# Patient Record
Sex: Female | Born: 1935
Health system: Southern US, Community
[De-identification: ages and names within clinical notes are randomized; demographics above are authoritative.]

## PROBLEM LIST (undated history)

## (undated) DIAGNOSIS — E7800 Pure hypercholesterolemia, unspecified: Secondary | ICD-10-CM

## (undated) DIAGNOSIS — I10 Essential (primary) hypertension: Secondary | ICD-10-CM

## (undated) DIAGNOSIS — E119 Type 2 diabetes mellitus without complications: Secondary | ICD-10-CM

## (undated) HISTORY — PX: ABDOMINAL HYSTERECTOMY: SHX81

## (undated) HISTORY — DX: Pure hypercholesterolemia, unspecified: E78.00

## (undated) HISTORY — PX: ABDOMINAL SURGERY: SHX537

## (undated) HISTORY — PX: TONSILLECTOMY: SUR1361

---

## 1998-05-31 ENCOUNTER — Other Ambulatory Visit: Admission: RE | Admit: 1998-05-31 | Discharge: 1998-05-31 | Payer: Self-pay | Admitting: Family Medicine

## 2000-01-16 ENCOUNTER — Encounter: Payer: Self-pay | Admitting: Family Medicine

## 2000-01-16 ENCOUNTER — Ambulatory Visit (HOSPITAL_COMMUNITY): Admission: RE | Admit: 2000-01-16 | Discharge: 2000-01-16 | Payer: Self-pay | Admitting: Family Medicine

## 2001-02-16 ENCOUNTER — Encounter: Payer: Self-pay | Admitting: Family Medicine

## 2001-02-16 ENCOUNTER — Ambulatory Visit (HOSPITAL_COMMUNITY): Admission: RE | Admit: 2001-02-16 | Discharge: 2001-02-16 | Payer: Self-pay | Admitting: Family Medicine

## 2001-03-10 ENCOUNTER — Encounter: Admission: RE | Admit: 2001-03-10 | Discharge: 2001-05-04 | Payer: Self-pay | Admitting: Family Medicine

## 2001-08-24 ENCOUNTER — Other Ambulatory Visit: Admission: RE | Admit: 2001-08-24 | Discharge: 2001-08-24 | Payer: Self-pay | Admitting: Family Medicine

## 2002-03-29 ENCOUNTER — Encounter: Payer: Self-pay | Admitting: Family Medicine

## 2002-03-29 ENCOUNTER — Ambulatory Visit (HOSPITAL_COMMUNITY): Admission: RE | Admit: 2002-03-29 | Discharge: 2002-03-29 | Payer: Self-pay | Admitting: Family Medicine

## 2003-02-04 HISTORY — PX: ANKLE SURGERY: SHX546

## 2003-07-05 ENCOUNTER — Ambulatory Visit (HOSPITAL_COMMUNITY): Admission: RE | Admit: 2003-07-05 | Discharge: 2003-07-05 | Payer: Self-pay | Admitting: Family Medicine

## 2003-08-09 ENCOUNTER — Emergency Department (HOSPITAL_COMMUNITY): Admission: EM | Admit: 2003-08-09 | Discharge: 2003-08-09 | Payer: Self-pay

## 2003-08-22 ENCOUNTER — Ambulatory Visit (HOSPITAL_BASED_OUTPATIENT_CLINIC_OR_DEPARTMENT_OTHER): Admission: RE | Admit: 2003-08-22 | Discharge: 2003-08-22 | Payer: Self-pay | Admitting: Orthopedic Surgery

## 2003-08-22 ENCOUNTER — Ambulatory Visit (HOSPITAL_COMMUNITY): Admission: RE | Admit: 2003-08-22 | Discharge: 2003-08-22 | Payer: Self-pay | Admitting: Orthopedic Surgery

## 2003-09-11 ENCOUNTER — Encounter: Admission: RE | Admit: 2003-09-11 | Discharge: 2003-11-28 | Payer: Self-pay | Admitting: Orthopedic Surgery

## 2004-03-07 ENCOUNTER — Other Ambulatory Visit: Admission: RE | Admit: 2004-03-07 | Discharge: 2004-03-07 | Payer: Self-pay | Admitting: Family Medicine

## 2004-11-11 ENCOUNTER — Ambulatory Visit (HOSPITAL_COMMUNITY): Admission: RE | Admit: 2004-11-11 | Discharge: 2004-11-11 | Payer: Self-pay | Admitting: Family Medicine

## 2004-12-09 ENCOUNTER — Emergency Department (HOSPITAL_COMMUNITY): Admission: EM | Admit: 2004-12-09 | Discharge: 2004-12-09 | Payer: Self-pay | Admitting: Emergency Medicine

## 2015-12-04 ENCOUNTER — Emergency Department (HOSPITAL_COMMUNITY)
Admission: EM | Admit: 2015-12-04 | Discharge: 2015-12-04 | Disposition: A | Discharge status: Home / Self Care | Payer: Medicare HMO | Attending: Emergency Medicine | Admitting: Emergency Medicine

## 2015-12-04 ENCOUNTER — Emergency Department (HOSPITAL_COMMUNITY): Payer: Medicare HMO

## 2015-12-04 ENCOUNTER — Encounter (HOSPITAL_COMMUNITY): Payer: Self-pay | Admitting: Emergency Medicine

## 2015-12-04 DIAGNOSIS — Y939 Activity, unspecified: Secondary | ICD-10-CM | POA: Insufficient documentation

## 2015-12-04 DIAGNOSIS — E119 Type 2 diabetes mellitus without complications: Secondary | ICD-10-CM | POA: Diagnosis not present

## 2015-12-04 DIAGNOSIS — Y929 Unspecified place or not applicable: Secondary | ICD-10-CM | POA: Insufficient documentation

## 2015-12-04 DIAGNOSIS — S01111A Laceration without foreign body of right eyelid and periocular area, initial encounter: Secondary | ICD-10-CM | POA: Diagnosis not present

## 2015-12-04 DIAGNOSIS — Z87891 Personal history of nicotine dependence: Secondary | ICD-10-CM | POA: Diagnosis not present

## 2015-12-04 DIAGNOSIS — S0181XA Laceration without foreign body of other part of head, initial encounter: Secondary | ICD-10-CM

## 2015-12-04 DIAGNOSIS — W0110XA Fall on same level from slipping, tripping and stumbling with subsequent striking against unspecified object, initial encounter: Secondary | ICD-10-CM | POA: Diagnosis not present

## 2015-12-04 DIAGNOSIS — Y999 Unspecified external cause status: Secondary | ICD-10-CM | POA: Insufficient documentation

## 2015-12-04 DIAGNOSIS — I10 Essential (primary) hypertension: Secondary | ICD-10-CM | POA: Insufficient documentation

## 2015-12-04 HISTORY — DX: Essential (primary) hypertension: I10

## 2015-12-04 HISTORY — DX: Type 2 diabetes mellitus without complications: E11.9

## 2015-12-04 MED ORDER — ACETAMINOPHEN 500 MG PO TABS
1000.0000 mg | ORAL_TABLET | Freq: Once | ORAL | Status: AC
Start: 2015-12-04 — End: 2015-12-04
  Administered 2015-12-04: 1000 mg via ORAL
  Filled 2015-12-04: qty 2

## 2015-12-04 MED ORDER — LIDOCAINE-EPINEPHRINE (PF) 1 %-1:200000 IJ SOLN
20.0000 mL | Freq: Once | INTRAMUSCULAR | Status: AC
  Administered 2015-12-04: 20 mL via INTRADERMAL
  Filled 2015-12-04: qty 30

## 2015-12-04 NOTE — ED Provider Notes (Signed)
[facility]-EMERGENCY DEPT Provider Note   CSN: [id number] Arrival date & time: 12/04/15  1249     History   Chief Complaint Chief Complaint  Patient presents with  . Fall  . Facial Injury  . Laceration    HPI Dana Dixon is a 82 y.o. female.  82 yo F with a chief complaint of a fall. The patient felt that her feet got tangled up and she landed on the right side of her face. She denies headache denies loss consciousness denies neck pain. She also thinks that she landed on her left hand and has pain to the third fourth and fifth digit. Denies chest pain denies abdominal pain denies back pain. She had a laceration above her right eye that she applied direct pressure and was able to get bleeding controlled. Last TDap was about a week ago.   The history is provided by the patient.  Fall  This is a new problem. The current episode started less than 1 hour ago. The problem occurs constantly. The problem has not changed since onset.Pertinent negatives include no chest pain, no headaches and no shortness of breath. Nothing aggravates the symptoms. Nothing relieves the symptoms. She has tried nothing for the symptoms. The treatment provided no relief.  Facial Injury  Associated symptoms: no congestion, no headaches, no nausea, no rhinorrhea, no vomiting and no wheezing   Laceration      Past Medical History:  Diagnosis Date  . Diabetes mellitus without complication (HCC)   . Hypertension     There are no active problems to display for this patient.   Past Surgical History:  Procedure Laterality Date  . ABDOMINAL HYSTERECTOMY    . ABDOMINAL SURGERY    . TONSILLECTOMY      OB History    No data available       Home Medications    Prior to Admission medications   Not on File    Family History No family history on file.  Social History Social History  Substance Use Topics  . Smoking status: Former Smoker    Quit date: 11/08/1979  . Smokeless tobacco: Never Used  .  Alcohol use 4.2 oz/week    7 Glasses of wine per week     Allergies   Sulfa antibiotics   Review of Systems Review of Systems  Constitutional: Negative for chills and fever.  HENT: Negative for congestion and rhinorrhea.   Eyes: Negative for redness and visual disturbance.  Respiratory: Negative for shortness of breath and wheezing.   Cardiovascular: Negative for chest pain and palpitations.  Gastrointestinal: Negative for nausea and vomiting.  Genitourinary: Negative for dysuria and urgency.  Musculoskeletal: Positive for arthralgias and myalgias.  Skin: Positive for wound. Negative for pallor.  Neurological: Negative for dizziness and headaches.     Physical Exam Updated Vital Signs BP 186/70   Pulse 71   Temp 97.5 F (36.4 C)   Resp 16   Ht 5\' 2"  (1.575 m)   Wt 145 lb (65.8 kg)   SpO2 94%   BMI 26.52 kg/m   Physical Exam  Constitutional: She is oriented to person, place, and time. She appears well-developed and well-nourished. No distress.  HENT:  Head: Normocephalic.  4.6 cm laceration just above the right eyebrow. No noted pain with palpation of the orbital rim. Extraocular motor intact. No visual changes. No facial nerve palsies. No midline C-spine tenderness. Patient is able to rotate her head back and forth 45 without difficulty.  Eyes:  EOM are normal. Pupils are equal, round, and reactive to light.  Neck: Normal range of motion. Neck supple.  Cardiovascular: Normal rate and regular rhythm.  Exam reveals no gallop and no friction rub.   No murmur heard. Pulmonary/Chest: Effort normal. She has no wheezes. She has no rales.  Abdominal: Soft. She exhibits no distension. There is no tenderness.  Musculoskeletal: She exhibits no edema or tenderness.  Palpated from head to toe only noted bony tenderness was to the lateral aspect of the left hand. Worst about the proximal aspect of the fourth and fifth digit. Full range of motion.  Neurological: She is alert and  oriented to person, place, and time.  Skin: Skin is warm and dry. She is not diaphoretic.  Psychiatric: She has a normal mood and affect. Her behavior is normal.  Nursing note and vitals reviewed.    ED Treatments / Results  Labs (all labs ordered are listed, but only abnormal results are displayed) Labs Reviewed - No data to display  EKG  EKG Interpretation None       Radiology Dg Hand Complete Left  Result Date: 12/04/2015 CLINICAL DATA:  Pain following fall EXAM: LEFT HAND - COMPLETE 3+ VIEW COMPARISON:  None. FINDINGS: Frontal, oblique, and lateral views were obtained. There is an obliquely oriented fracture through the mid portion of the fourth metacarpal with mild dorsal displacement of the distal fracture fragment with respect to the proximal fragment, best appreciated on the oblique view. In addition, there is an obliquely oriented nondisplaced fracture of the proximal aspect of the fifth proximal phalanx. This fracture is best seen on the frontal view. No other fractures are evident. No dislocations. There is osteoarthritic change in all MCP, PIP, and DIP joints. Intra-articular calcification is noted in the third PIP joint region. Osteoarthritic change is also noted in the first carpal -metacarpal joint. Bones appear somewhat osteoporotic. IMPRESSION: Displaced fracture fourth metacarpal. Nondisplaced fracture proximal aspect fifth proximal phalanx. No dislocations. Multilevel osteoarthritic change noted. Electronically Signed   By: Bretta BangWilliam  Woodruff III M.D.   On: 12/04/2015 13:53    Procedures .Marland Kitchen.Laceration Repair Date/Time: 12/04/2015 2:53 PM Performed by: Adela LankFLOYD, Ousman Dise Authorized by: Melene PlanFLOYD, [place] Zaragoza   Consent:    Consent obtained:  Verbal   Consent given by:  Patient   Risks discussed:  Infection, pain, poor cosmetic result, poor wound healing and need for additional repair   Alternatives discussed:  No treatment and delayed treatment Anesthesia (see MAR for exact dosages):      Anesthesia method:  Local infiltration   Local anesthetic:  Lidocaine 1% WITH epi Laceration details:    Location:  Face   Face location:  R eyebrow   Length (cm):  4.6 Repair type:    Repair type:  Intermediate Pre-procedure details:    Preparation:  Patient was prepped and draped in usual sterile fashion Exploration:    Hemostasis achieved with:  Direct pressure and epinephrine   Wound exploration: entire depth of wound probed and visualized     Contaminated: no   Treatment:    Wound cleansed with: .chlorahexedene.   Amount of cleaning:  Extensive   Irrigation solution:  Sterile saline   Irrigation volume:  300   Irrigation method:  Pressure wash   Visualized foreign bodies/material removed: no   Skin repair:    Repair method:  Sutures   Suture size:  4-0   Suture material:  Nylon   Suture technique:  Simple interrupted   Number of sutures:  3 Approximation:    Approximation:  Close Post-procedure details:    Dressing:  Open (no dressing)   Patient tolerance of procedure:  Tolerated well, no immediate complications   (including critical care time)  Medications Ordered in ED Medications  acetaminophen (TYLENOL) tablet 1,000 mg (1,000 mg Oral Given 12/04/15 1440)  lidocaine-EPINEPHrine (XYLOCAINE-EPINEPHrine) 1 %-1:200000 (PF) injection 20 mL (20 mLs Intradermal Given by Other 12/04/15 1441)     Initial Impression / Assessment and Plan / ED Course  I have reviewed the triage vital signs and the nursing notes.  Pertinent labs & imaging results that were available during my care of the patient were reviewed by me and considered in my medical decision making (see chart for details).  Clinical Course    82 yo F With a chief complaint of a mechanical fall. Discussed with the patient the need for imaging of her head and C-spine based on clinical decision rules. Patient is declining that at this time and she is not having a headache or neck pain. Will suture the wound  at bedside. Patient found to have a left fourth and fifth metacarpal fracture. We'll place in a ulnar gutter.  8:42 PM:  I have discussed the diagnosis/risks/treatment options with the patient and family and believe the pt to be eligible for discharge home to follow-up with PCP/Hand. We also discussed returning to the ED immediately if new or worsening sx occur. We discussed the sx which are most concerning (e.g., sudden worsening pain, fever, inability to tolerate by mouth) that necessitate immediate return. Medications administered to the patient during their visit and any new prescriptions provided to the patient are listed below.  Medications given during this visit Medications  acetaminophen (TYLENOL) tablet 1,000 mg (1,000 mg Oral Given 12/04/15 1440)  lidocaine-EPINEPHrine (XYLOCAINE-EPINEPHrine) 1 %-1:200000 (PF) injection 20 mL (20 mLs Intradermal Given by Other 12/04/15 1441)     The patient appears reasonably screen and/or stabilized for discharge and I doubt any other medical condition or other [facility] requiring further screening, evaluation, or treatment in the ED at this time prior to discharge.    Final Clinical Impressions(s) / ED Diagnoses   Final diagnoses:  Facial laceration, initial encounter    New Prescriptions There are no discharge medications for this patient.    Melene Plan, DO 12/04/15 2042

## 2015-12-04 NOTE — ED Triage Notes (Addendum)
Pt reports tripping over her own feet and falling to the ground. Facial abrasions with 2 inch lac over right eyebrow noted and bleeding is controlled. Also reports three fingers on left hand with pain, no deformity noted. Denies LOC. NAD at triage.

## 2016-04-01 DIAGNOSIS — Z0000 Encounter for general adult medical examination without abnormal findings: Secondary | ICD-10-CM | POA: Diagnosis not present

## 2016-04-01 DIAGNOSIS — R251 Tremor, unspecified: Secondary | ICD-10-CM | POA: Diagnosis not present

## 2016-04-01 DIAGNOSIS — Z7984 Long term (current) use of oral hypoglycemic drugs: Secondary | ICD-10-CM | POA: Diagnosis not present

## 2016-04-01 DIAGNOSIS — E785 Hyperlipidemia, unspecified: Secondary | ICD-10-CM | POA: Diagnosis not present

## 2016-04-01 DIAGNOSIS — I10 Essential (primary) hypertension: Secondary | ICD-10-CM | POA: Diagnosis not present

## 2016-04-01 DIAGNOSIS — Z9181 History of falling: Secondary | ICD-10-CM | POA: Diagnosis not present

## 2016-04-01 DIAGNOSIS — E119 Type 2 diabetes mellitus without complications: Secondary | ICD-10-CM | POA: Diagnosis not present

## 2016-04-01 DIAGNOSIS — Z87891 Personal history of nicotine dependence: Secondary | ICD-10-CM | POA: Diagnosis not present

## 2016-04-01 DIAGNOSIS — R69 Illness, unspecified: Secondary | ICD-10-CM | POA: Diagnosis not present

## 2016-04-01 DIAGNOSIS — K228 Other specified diseases of esophagus: Secondary | ICD-10-CM | POA: Diagnosis not present

## 2016-04-01 DIAGNOSIS — K219 Gastro-esophageal reflux disease without esophagitis: Secondary | ICD-10-CM | POA: Diagnosis not present

## 2016-04-01 DIAGNOSIS — H9113 Presbycusis, bilateral: Secondary | ICD-10-CM | POA: Diagnosis not present

## 2016-04-01 DIAGNOSIS — H259 Unspecified age-related cataract: Secondary | ICD-10-CM | POA: Diagnosis not present

## 2016-04-06 DIAGNOSIS — R69 Illness, unspecified: Secondary | ICD-10-CM | POA: Diagnosis not present

## 2016-05-06 DIAGNOSIS — E119 Type 2 diabetes mellitus without complications: Secondary | ICD-10-CM | POA: Diagnosis not present

## 2016-05-06 DIAGNOSIS — I10 Essential (primary) hypertension: Secondary | ICD-10-CM | POA: Diagnosis not present

## 2016-06-10 DIAGNOSIS — E119 Type 2 diabetes mellitus without complications: Secondary | ICD-10-CM | POA: Diagnosis not present

## 2016-06-10 DIAGNOSIS — I10 Essential (primary) hypertension: Secondary | ICD-10-CM | POA: Diagnosis not present

## 2016-06-20 DIAGNOSIS — Z1231 Encounter for screening mammogram for malignant neoplasm of breast: Secondary | ICD-10-CM | POA: Diagnosis not present

## 2016-06-25 DIAGNOSIS — H02834 Dermatochalasis of left upper eyelid: Secondary | ICD-10-CM | POA: Diagnosis not present

## 2016-06-25 DIAGNOSIS — E119 Type 2 diabetes mellitus without complications: Secondary | ICD-10-CM | POA: Diagnosis not present

## 2016-06-25 DIAGNOSIS — H35373 Puckering of macula, bilateral: Secondary | ICD-10-CM | POA: Diagnosis not present

## 2016-06-25 DIAGNOSIS — H02831 Dermatochalasis of right upper eyelid: Secondary | ICD-10-CM | POA: Diagnosis not present

## 2016-06-25 DIAGNOSIS — H353131 Nonexudative age-related macular degeneration, bilateral, early dry stage: Secondary | ICD-10-CM | POA: Diagnosis not present

## 2016-06-25 DIAGNOSIS — H25813 Combined forms of age-related cataract, bilateral: Secondary | ICD-10-CM | POA: Diagnosis not present

## 2016-07-02 DIAGNOSIS — R350 Frequency of micturition: Secondary | ICD-10-CM | POA: Diagnosis not present

## 2016-07-02 DIAGNOSIS — Z6828 Body mass index (BMI) 28.0-28.9, adult: Secondary | ICD-10-CM | POA: Diagnosis not present

## 2016-07-02 DIAGNOSIS — K219 Gastro-esophageal reflux disease without esophagitis: Secondary | ICD-10-CM | POA: Diagnosis not present

## 2016-07-02 DIAGNOSIS — E1122 Type 2 diabetes mellitus with diabetic chronic kidney disease: Secondary | ICD-10-CM | POA: Diagnosis not present

## 2016-07-02 DIAGNOSIS — N390 Urinary tract infection, site not specified: Secondary | ICD-10-CM | POA: Diagnosis not present

## 2016-07-02 DIAGNOSIS — R69 Illness, unspecified: Secondary | ICD-10-CM | POA: Diagnosis not present

## 2016-07-02 DIAGNOSIS — N183 Chronic kidney disease, stage 3 (moderate): Secondary | ICD-10-CM | POA: Diagnosis not present

## 2016-07-02 DIAGNOSIS — Z299 Encounter for prophylactic measures, unspecified: Secondary | ICD-10-CM | POA: Diagnosis not present

## 2016-07-02 DIAGNOSIS — I10 Essential (primary) hypertension: Secondary | ICD-10-CM | POA: Diagnosis not present

## 2016-07-02 DIAGNOSIS — E1165 Type 2 diabetes mellitus with hyperglycemia: Secondary | ICD-10-CM | POA: Diagnosis not present

## 2016-07-16 DIAGNOSIS — R69 Illness, unspecified: Secondary | ICD-10-CM | POA: Diagnosis not present

## 2016-08-11 DIAGNOSIS — H25812 Combined forms of age-related cataract, left eye: Secondary | ICD-10-CM | POA: Diagnosis not present

## 2016-08-11 DIAGNOSIS — H2512 Age-related nuclear cataract, left eye: Secondary | ICD-10-CM | POA: Diagnosis not present

## 2016-08-19 DIAGNOSIS — E2839 Other primary ovarian failure: Secondary | ICD-10-CM | POA: Diagnosis not present

## 2016-08-26 DIAGNOSIS — I10 Essential (primary) hypertension: Secondary | ICD-10-CM | POA: Diagnosis not present

## 2016-08-26 DIAGNOSIS — E119 Type 2 diabetes mellitus without complications: Secondary | ICD-10-CM | POA: Diagnosis not present

## 2016-10-08 DIAGNOSIS — E7800 Pure hypercholesterolemia, unspecified: Secondary | ICD-10-CM | POA: Diagnosis not present

## 2016-10-08 DIAGNOSIS — N390 Urinary tract infection, site not specified: Secondary | ICD-10-CM | POA: Diagnosis not present

## 2016-10-08 DIAGNOSIS — Z6828 Body mass index (BMI) 28.0-28.9, adult: Secondary | ICD-10-CM | POA: Diagnosis not present

## 2016-10-08 DIAGNOSIS — E1165 Type 2 diabetes mellitus with hyperglycemia: Secondary | ICD-10-CM | POA: Diagnosis not present

## 2016-10-08 DIAGNOSIS — N183 Chronic kidney disease, stage 3 (moderate): Secondary | ICD-10-CM | POA: Diagnosis not present

## 2016-10-08 DIAGNOSIS — R350 Frequency of micturition: Secondary | ICD-10-CM | POA: Diagnosis not present

## 2016-10-08 DIAGNOSIS — E1122 Type 2 diabetes mellitus with diabetic chronic kidney disease: Secondary | ICD-10-CM | POA: Diagnosis not present

## 2016-10-08 DIAGNOSIS — Z299 Encounter for prophylactic measures, unspecified: Secondary | ICD-10-CM | POA: Diagnosis not present

## 2016-10-08 DIAGNOSIS — I10 Essential (primary) hypertension: Secondary | ICD-10-CM | POA: Diagnosis not present

## 2016-10-08 DIAGNOSIS — R69 Illness, unspecified: Secondary | ICD-10-CM | POA: Diagnosis not present

## 2016-10-15 DIAGNOSIS — R69 Illness, unspecified: Secondary | ICD-10-CM | POA: Diagnosis not present

## 2016-10-17 DIAGNOSIS — I10 Essential (primary) hypertension: Secondary | ICD-10-CM | POA: Diagnosis not present

## 2016-10-17 DIAGNOSIS — E119 Type 2 diabetes mellitus without complications: Secondary | ICD-10-CM | POA: Diagnosis not present

## 2016-11-11 DIAGNOSIS — R350 Frequency of micturition: Secondary | ICD-10-CM | POA: Diagnosis not present

## 2016-11-11 DIAGNOSIS — N390 Urinary tract infection, site not specified: Secondary | ICD-10-CM | POA: Diagnosis not present

## 2016-11-11 DIAGNOSIS — E1165 Type 2 diabetes mellitus with hyperglycemia: Secondary | ICD-10-CM | POA: Diagnosis not present

## 2016-11-11 DIAGNOSIS — N183 Chronic kidney disease, stage 3 (moderate): Secondary | ICD-10-CM | POA: Diagnosis not present

## 2016-11-11 DIAGNOSIS — Z6828 Body mass index (BMI) 28.0-28.9, adult: Secondary | ICD-10-CM | POA: Diagnosis not present

## 2016-11-11 DIAGNOSIS — E1122 Type 2 diabetes mellitus with diabetic chronic kidney disease: Secondary | ICD-10-CM | POA: Diagnosis not present

## 2016-11-11 DIAGNOSIS — I10 Essential (primary) hypertension: Secondary | ICD-10-CM | POA: Diagnosis not present

## 2016-11-11 DIAGNOSIS — E7800 Pure hypercholesterolemia, unspecified: Secondary | ICD-10-CM | POA: Diagnosis not present

## 2016-11-11 DIAGNOSIS — Z299 Encounter for prophylactic measures, unspecified: Secondary | ICD-10-CM | POA: Diagnosis not present

## 2016-11-11 DIAGNOSIS — Z23 Encounter for immunization: Secondary | ICD-10-CM | POA: Diagnosis not present

## 2016-11-21 IMAGING — CR DG HAND COMPLETE 3+V*L*
3 series · 3 of 3 positions shown · non-contrast
Comparison: None.

CLINICAL DATA: Pain following fall

EXAM:
LEFT HAND - COMPLETE 3+ VIEW

[x hand pa left]
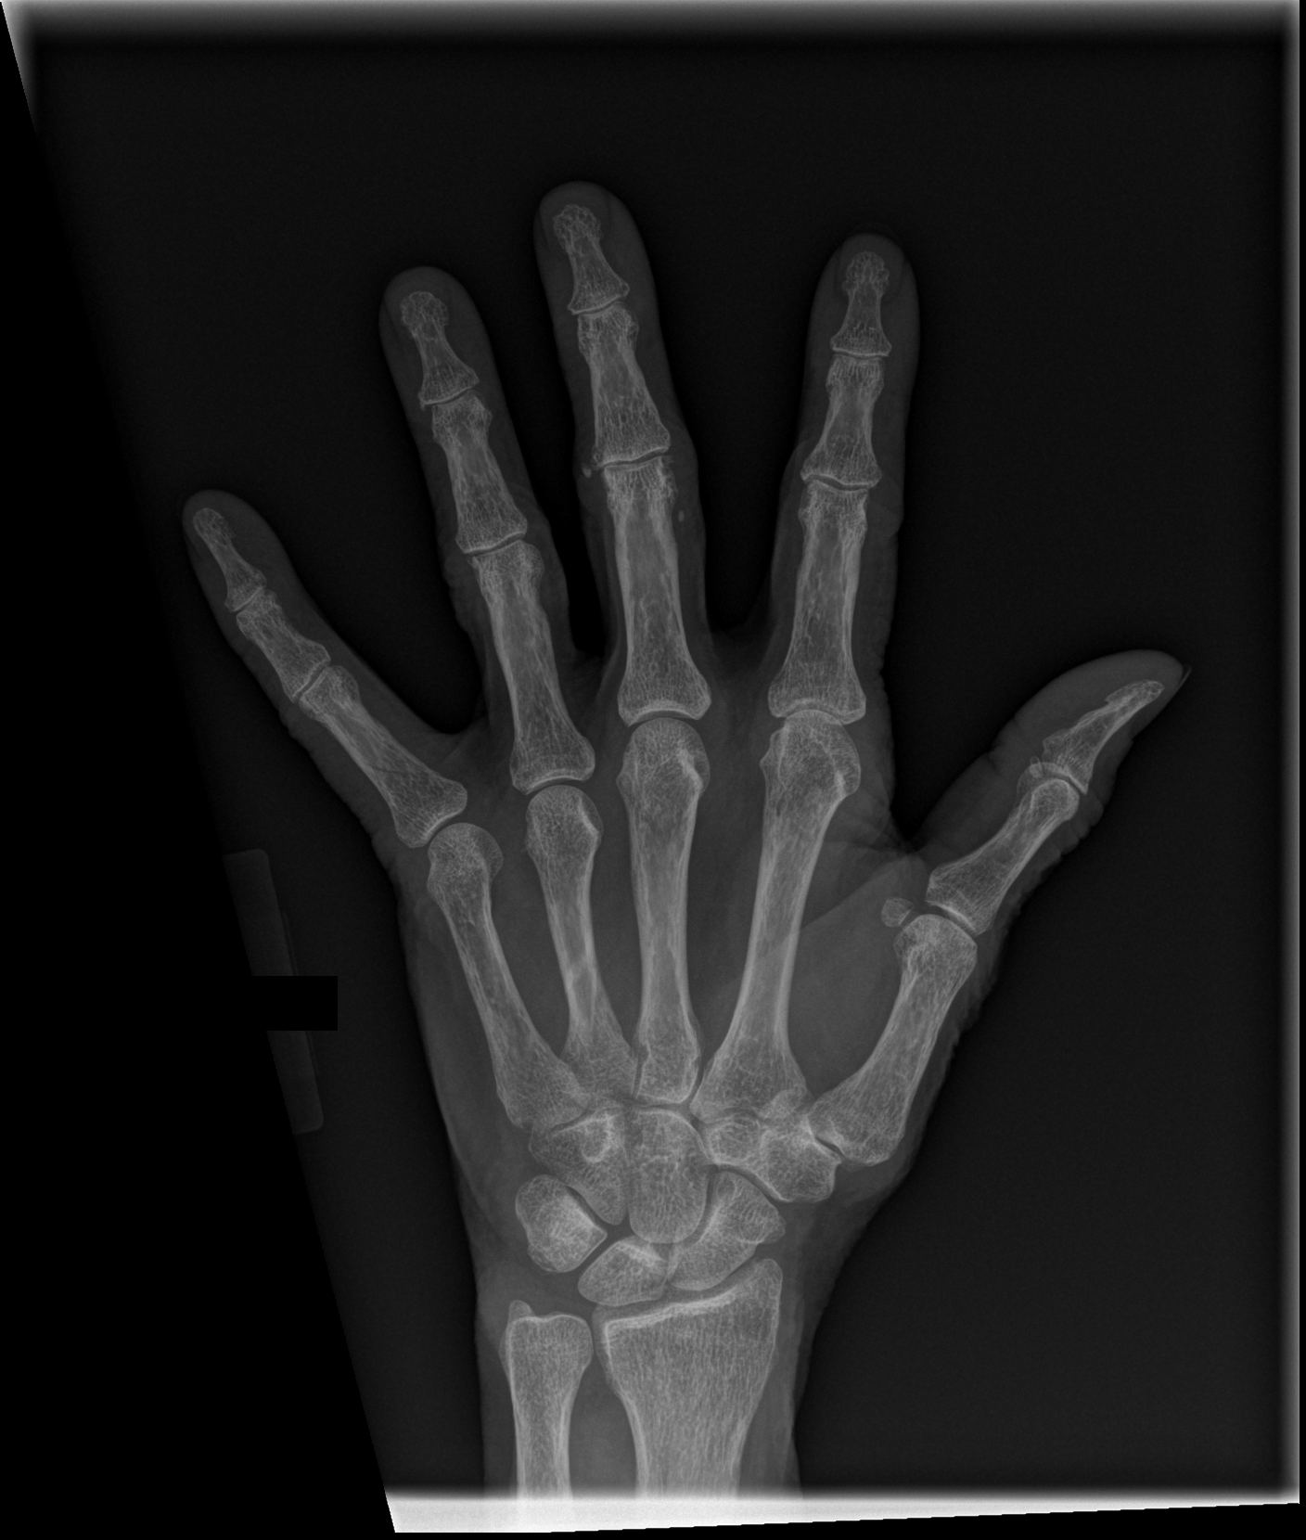

[x hand lat left]
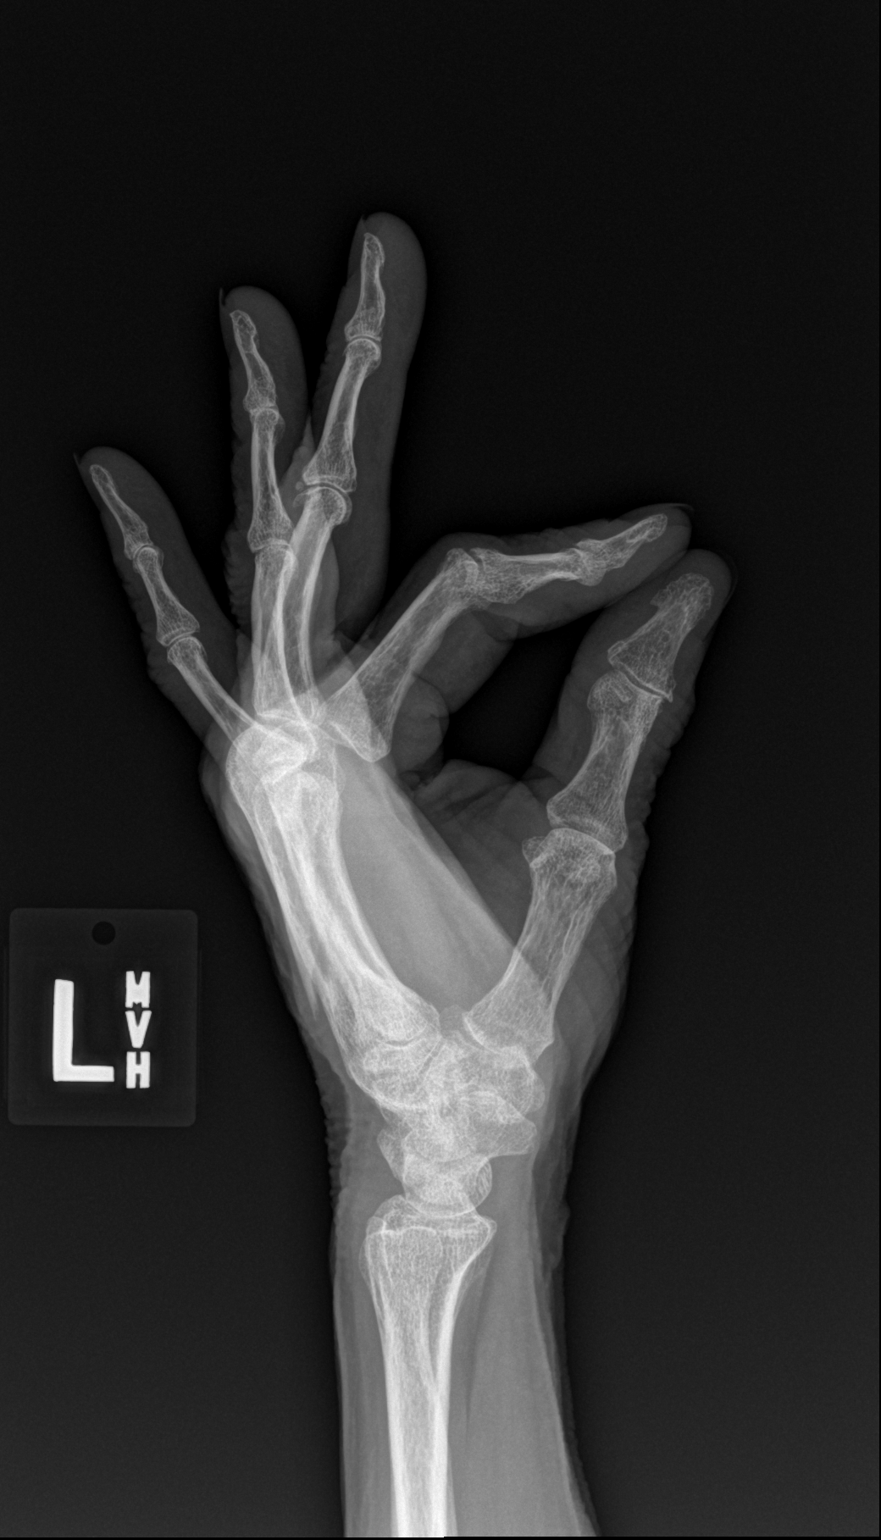

[x hand obl left]
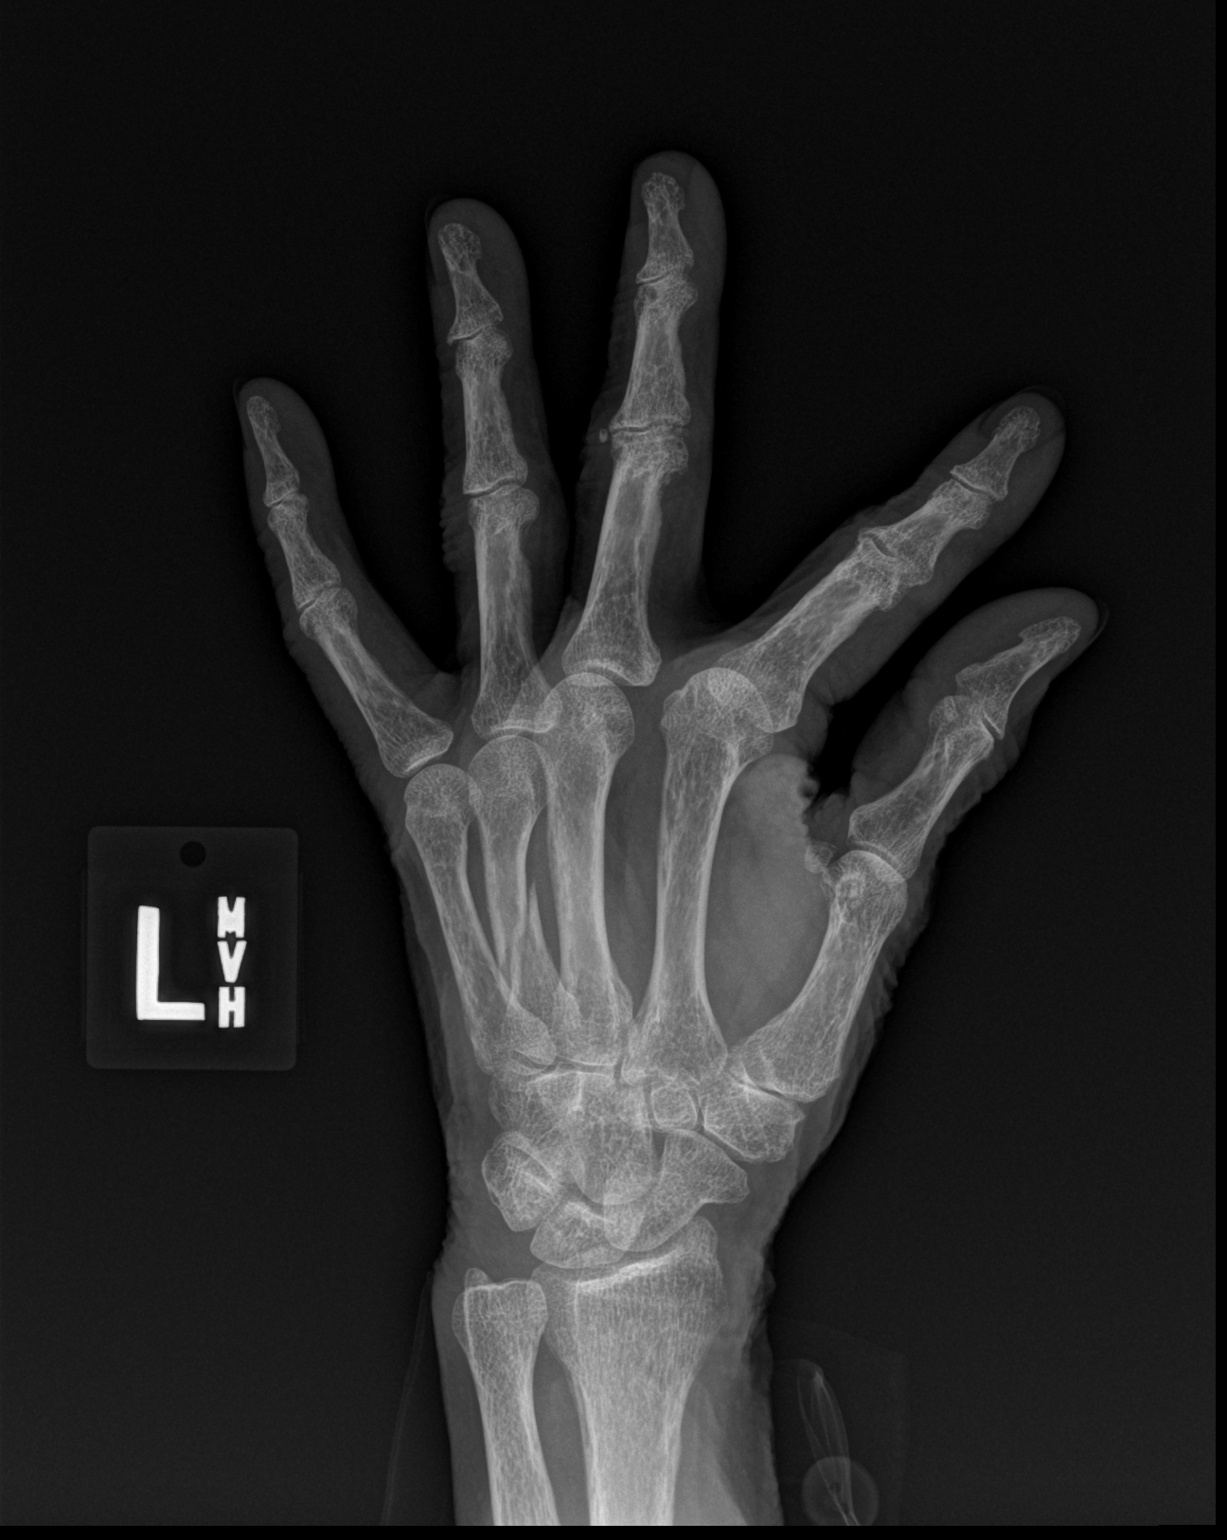

[3 of 3 positions shown; findings below may reference images not displayed]

FINDINGS: Frontal, oblique, and lateral views were obtained. There is an
obliquely oriented fracture through the mid portion of the fourth
metacarpal with mild dorsal displacement of the distal fracture
fragment with respect to the proximal fragment, best appreciated on
the oblique view. In addition, there is an obliquely oriented
nondisplaced fracture of the proximal aspect of the fifth proximal
phalanx. This fracture is best seen on the frontal view. No other
fractures are evident. No dislocations. There is osteoarthritic
change in all MCP, PIP, and DIP joints. Intra-articular
calcification is noted in the third PIP joint region. Osteoarthritic
change is also noted in the first carpal -metacarpal joint. Bones
appear somewhat osteoporotic.
IMPRESSION: Displaced fracture fourth metacarpal. Nondisplaced fracture proximal
aspect fifth proximal phalanx. No dislocations. Multilevel
osteoarthritic change noted.

## 2016-12-22 DIAGNOSIS — I10 Essential (primary) hypertension: Secondary | ICD-10-CM | POA: Diagnosis not present

## 2016-12-22 DIAGNOSIS — E119 Type 2 diabetes mellitus without complications: Secondary | ICD-10-CM | POA: Diagnosis not present

## 2017-01-18 DIAGNOSIS — R69 Illness, unspecified: Secondary | ICD-10-CM | POA: Diagnosis not present

## 2017-02-12 DIAGNOSIS — E119 Type 2 diabetes mellitus without complications: Secondary | ICD-10-CM | POA: Diagnosis not present

## 2017-02-12 DIAGNOSIS — I10 Essential (primary) hypertension: Secondary | ICD-10-CM | POA: Diagnosis not present

## 2017-02-19 DIAGNOSIS — E1122 Type 2 diabetes mellitus with diabetic chronic kidney disease: Secondary | ICD-10-CM | POA: Diagnosis not present

## 2017-02-19 DIAGNOSIS — Z299 Encounter for prophylactic measures, unspecified: Secondary | ICD-10-CM | POA: Diagnosis not present

## 2017-02-19 DIAGNOSIS — Z6829 Body mass index (BMI) 29.0-29.9, adult: Secondary | ICD-10-CM | POA: Diagnosis not present

## 2017-02-19 DIAGNOSIS — Z87891 Personal history of nicotine dependence: Secondary | ICD-10-CM | POA: Diagnosis not present

## 2017-02-19 DIAGNOSIS — E1165 Type 2 diabetes mellitus with hyperglycemia: Secondary | ICD-10-CM | POA: Diagnosis not present

## 2017-02-19 DIAGNOSIS — N183 Chronic kidney disease, stage 3 (moderate): Secondary | ICD-10-CM | POA: Diagnosis not present

## 2017-02-19 DIAGNOSIS — I10 Essential (primary) hypertension: Secondary | ICD-10-CM | POA: Diagnosis not present

## 2017-03-03 DIAGNOSIS — Z1339 Encounter for screening examination for other mental health and behavioral disorders: Secondary | ICD-10-CM | POA: Diagnosis not present

## 2017-03-03 DIAGNOSIS — E1122 Type 2 diabetes mellitus with diabetic chronic kidney disease: Secondary | ICD-10-CM | POA: Diagnosis not present

## 2017-03-03 DIAGNOSIS — Z7189 Other specified counseling: Secondary | ICD-10-CM | POA: Diagnosis not present

## 2017-03-03 DIAGNOSIS — Z6829 Body mass index (BMI) 29.0-29.9, adult: Secondary | ICD-10-CM | POA: Diagnosis not present

## 2017-03-03 DIAGNOSIS — Z299 Encounter for prophylactic measures, unspecified: Secondary | ICD-10-CM | POA: Diagnosis not present

## 2017-03-03 DIAGNOSIS — Z0000 Encounter for general adult medical examination without abnormal findings: Secondary | ICD-10-CM | POA: Diagnosis not present

## 2017-03-03 DIAGNOSIS — Z79899 Other long term (current) drug therapy: Secondary | ICD-10-CM | POA: Diagnosis not present

## 2017-03-03 DIAGNOSIS — N183 Chronic kidney disease, stage 3 (moderate): Secondary | ICD-10-CM | POA: Diagnosis not present

## 2017-03-03 DIAGNOSIS — Z1331 Encounter for screening for depression: Secondary | ICD-10-CM | POA: Diagnosis not present

## 2017-03-03 DIAGNOSIS — E559 Vitamin D deficiency, unspecified: Secondary | ICD-10-CM | POA: Diagnosis not present

## 2017-03-03 DIAGNOSIS — R5383 Other fatigue: Secondary | ICD-10-CM | POA: Diagnosis not present

## 2017-03-03 DIAGNOSIS — Z1211 Encounter for screening for malignant neoplasm of colon: Secondary | ICD-10-CM | POA: Diagnosis not present

## 2017-03-03 DIAGNOSIS — I10 Essential (primary) hypertension: Secondary | ICD-10-CM | POA: Diagnosis not present

## 2017-03-03 DIAGNOSIS — E1165 Type 2 diabetes mellitus with hyperglycemia: Secondary | ICD-10-CM | POA: Diagnosis not present

## 2017-03-11 DIAGNOSIS — H353131 Nonexudative age-related macular degeneration, bilateral, early dry stage: Secondary | ICD-10-CM | POA: Diagnosis not present

## 2017-03-11 DIAGNOSIS — H35373 Puckering of macula, bilateral: Secondary | ICD-10-CM | POA: Diagnosis not present

## 2017-03-11 DIAGNOSIS — Z961 Presence of intraocular lens: Secondary | ICD-10-CM | POA: Diagnosis not present

## 2017-03-11 DIAGNOSIS — E119 Type 2 diabetes mellitus without complications: Secondary | ICD-10-CM | POA: Diagnosis not present

## 2017-03-11 DIAGNOSIS — H26492 Other secondary cataract, left eye: Secondary | ICD-10-CM | POA: Diagnosis not present

## 2017-03-11 DIAGNOSIS — H25811 Combined forms of age-related cataract, right eye: Secondary | ICD-10-CM | POA: Diagnosis not present

## 2017-03-17 DIAGNOSIS — I10 Essential (primary) hypertension: Secondary | ICD-10-CM | POA: Diagnosis not present

## 2017-03-17 DIAGNOSIS — E119 Type 2 diabetes mellitus without complications: Secondary | ICD-10-CM | POA: Diagnosis not present

## 2017-04-15 DIAGNOSIS — E119 Type 2 diabetes mellitus without complications: Secondary | ICD-10-CM | POA: Diagnosis not present

## 2017-04-15 DIAGNOSIS — I10 Essential (primary) hypertension: Secondary | ICD-10-CM | POA: Diagnosis not present

## 2017-04-29 DIAGNOSIS — R69 Illness, unspecified: Secondary | ICD-10-CM | POA: Diagnosis not present

## 2017-05-06 DIAGNOSIS — Z87891 Personal history of nicotine dependence: Secondary | ICD-10-CM | POA: Diagnosis not present

## 2017-05-06 DIAGNOSIS — Z299 Encounter for prophylactic measures, unspecified: Secondary | ICD-10-CM | POA: Diagnosis not present

## 2017-05-06 DIAGNOSIS — I10 Essential (primary) hypertension: Secondary | ICD-10-CM | POA: Diagnosis not present

## 2017-05-06 DIAGNOSIS — J069 Acute upper respiratory infection, unspecified: Secondary | ICD-10-CM | POA: Diagnosis not present

## 2017-05-06 DIAGNOSIS — N183 Chronic kidney disease, stage 3 (moderate): Secondary | ICD-10-CM | POA: Diagnosis not present

## 2017-05-06 DIAGNOSIS — Z6829 Body mass index (BMI) 29.0-29.9, adult: Secondary | ICD-10-CM | POA: Diagnosis not present

## 2017-05-21 DIAGNOSIS — I10 Essential (primary) hypertension: Secondary | ICD-10-CM | POA: Diagnosis not present

## 2017-05-21 DIAGNOSIS — E119 Type 2 diabetes mellitus without complications: Secondary | ICD-10-CM | POA: Diagnosis not present

## 2017-06-01 DIAGNOSIS — K5900 Constipation, unspecified: Secondary | ICD-10-CM | POA: Diagnosis not present

## 2017-06-01 DIAGNOSIS — E785 Hyperlipidemia, unspecified: Secondary | ICD-10-CM | POA: Diagnosis not present

## 2017-06-01 DIAGNOSIS — K219 Gastro-esophageal reflux disease without esophagitis: Secondary | ICD-10-CM | POA: Diagnosis not present

## 2017-06-01 DIAGNOSIS — R69 Illness, unspecified: Secondary | ICD-10-CM | POA: Diagnosis not present

## 2017-06-01 DIAGNOSIS — F419 Anxiety disorder, unspecified: Secondary | ICD-10-CM | POA: Diagnosis not present

## 2017-06-01 DIAGNOSIS — E1136 Type 2 diabetes mellitus with diabetic cataract: Secondary | ICD-10-CM | POA: Diagnosis not present

## 2017-06-01 DIAGNOSIS — I951 Orthostatic hypotension: Secondary | ICD-10-CM | POA: Diagnosis not present

## 2017-06-01 DIAGNOSIS — E1151 Type 2 diabetes mellitus with diabetic peripheral angiopathy without gangrene: Secondary | ICD-10-CM | POA: Diagnosis not present

## 2017-06-01 DIAGNOSIS — I10 Essential (primary) hypertension: Secondary | ICD-10-CM | POA: Diagnosis not present

## 2017-06-03 DIAGNOSIS — E1165 Type 2 diabetes mellitus with hyperglycemia: Secondary | ICD-10-CM | POA: Diagnosis not present

## 2017-06-03 DIAGNOSIS — I10 Essential (primary) hypertension: Secondary | ICD-10-CM | POA: Diagnosis not present

## 2017-06-03 DIAGNOSIS — N183 Chronic kidney disease, stage 3 (moderate): Secondary | ICD-10-CM | POA: Diagnosis not present

## 2017-06-03 DIAGNOSIS — Z299 Encounter for prophylactic measures, unspecified: Secondary | ICD-10-CM | POA: Diagnosis not present

## 2017-06-03 DIAGNOSIS — Z6828 Body mass index (BMI) 28.0-28.9, adult: Secondary | ICD-10-CM | POA: Diagnosis not present

## 2017-06-03 DIAGNOSIS — E1122 Type 2 diabetes mellitus with diabetic chronic kidney disease: Secondary | ICD-10-CM | POA: Diagnosis not present

## 2017-06-18 DIAGNOSIS — I10 Essential (primary) hypertension: Secondary | ICD-10-CM | POA: Diagnosis not present

## 2017-06-18 DIAGNOSIS — E119 Type 2 diabetes mellitus without complications: Secondary | ICD-10-CM | POA: Diagnosis not present

## 2017-08-11 DIAGNOSIS — E1165 Type 2 diabetes mellitus with hyperglycemia: Secondary | ICD-10-CM | POA: Diagnosis not present

## 2017-08-11 DIAGNOSIS — Z6828 Body mass index (BMI) 28.0-28.9, adult: Secondary | ICD-10-CM | POA: Diagnosis not present

## 2017-08-11 DIAGNOSIS — Z299 Encounter for prophylactic measures, unspecified: Secondary | ICD-10-CM | POA: Diagnosis not present

## 2017-08-11 DIAGNOSIS — I10 Essential (primary) hypertension: Secondary | ICD-10-CM | POA: Diagnosis not present

## 2017-08-11 DIAGNOSIS — E1122 Type 2 diabetes mellitus with diabetic chronic kidney disease: Secondary | ICD-10-CM | POA: Diagnosis not present

## 2017-08-11 DIAGNOSIS — N183 Chronic kidney disease, stage 3 (moderate): Secondary | ICD-10-CM | POA: Diagnosis not present

## 2017-08-17 DIAGNOSIS — Z1231 Encounter for screening mammogram for malignant neoplasm of breast: Secondary | ICD-10-CM | POA: Diagnosis not present

## 2017-08-27 DIAGNOSIS — I10 Essential (primary) hypertension: Secondary | ICD-10-CM | POA: Diagnosis not present

## 2017-08-27 DIAGNOSIS — E119 Type 2 diabetes mellitus without complications: Secondary | ICD-10-CM | POA: Diagnosis not present

## 2017-08-31 DIAGNOSIS — R69 Illness, unspecified: Secondary | ICD-10-CM | POA: Diagnosis not present

## 2017-09-25 DIAGNOSIS — E119 Type 2 diabetes mellitus without complications: Secondary | ICD-10-CM | POA: Diagnosis not present

## 2017-09-25 DIAGNOSIS — I10 Essential (primary) hypertension: Secondary | ICD-10-CM | POA: Diagnosis not present

## 2017-10-23 DIAGNOSIS — E119 Type 2 diabetes mellitus without complications: Secondary | ICD-10-CM | POA: Diagnosis not present

## 2017-10-23 DIAGNOSIS — I10 Essential (primary) hypertension: Secondary | ICD-10-CM | POA: Diagnosis not present

## 2017-10-29 DIAGNOSIS — E1165 Type 2 diabetes mellitus with hyperglycemia: Secondary | ICD-10-CM | POA: Diagnosis not present

## 2017-10-29 DIAGNOSIS — I10 Essential (primary) hypertension: Secondary | ICD-10-CM | POA: Diagnosis not present

## 2017-10-29 DIAGNOSIS — E1122 Type 2 diabetes mellitus with diabetic chronic kidney disease: Secondary | ICD-10-CM | POA: Diagnosis not present

## 2017-10-29 DIAGNOSIS — Z23 Encounter for immunization: Secondary | ICD-10-CM | POA: Diagnosis not present

## 2017-10-29 DIAGNOSIS — Z299 Encounter for prophylactic measures, unspecified: Secondary | ICD-10-CM | POA: Diagnosis not present

## 2017-10-29 DIAGNOSIS — N183 Chronic kidney disease, stage 3 (moderate): Secondary | ICD-10-CM | POA: Diagnosis not present

## 2017-10-29 DIAGNOSIS — Z6828 Body mass index (BMI) 28.0-28.9, adult: Secondary | ICD-10-CM | POA: Diagnosis not present

## 2017-11-26 DIAGNOSIS — I10 Essential (primary) hypertension: Secondary | ICD-10-CM | POA: Diagnosis not present

## 2017-11-26 DIAGNOSIS — E119 Type 2 diabetes mellitus without complications: Secondary | ICD-10-CM | POA: Diagnosis not present

## 2018-01-04 DIAGNOSIS — E119 Type 2 diabetes mellitus without complications: Secondary | ICD-10-CM | POA: Diagnosis not present

## 2018-01-04 DIAGNOSIS — R69 Illness, unspecified: Secondary | ICD-10-CM | POA: Diagnosis not present

## 2018-01-04 DIAGNOSIS — I10 Essential (primary) hypertension: Secondary | ICD-10-CM | POA: Diagnosis not present

## 2018-02-09 DIAGNOSIS — I10 Essential (primary) hypertension: Secondary | ICD-10-CM | POA: Diagnosis not present

## 2018-02-09 DIAGNOSIS — E119 Type 2 diabetes mellitus without complications: Secondary | ICD-10-CM | POA: Diagnosis not present

## 2018-03-04 DIAGNOSIS — R296 Repeated falls: Secondary | ICD-10-CM | POA: Diagnosis not present

## 2018-03-04 DIAGNOSIS — Z0000 Encounter for general adult medical examination without abnormal findings: Secondary | ICD-10-CM | POA: Diagnosis not present

## 2018-03-04 DIAGNOSIS — Z1331 Encounter for screening for depression: Secondary | ICD-10-CM | POA: Diagnosis not present

## 2018-03-04 DIAGNOSIS — Z79899 Other long term (current) drug therapy: Secondary | ICD-10-CM | POA: Diagnosis not present

## 2018-03-04 DIAGNOSIS — Z1339 Encounter for screening examination for other mental health and behavioral disorders: Secondary | ICD-10-CM | POA: Diagnosis not present

## 2018-03-04 DIAGNOSIS — E7800 Pure hypercholesterolemia, unspecified: Secondary | ICD-10-CM | POA: Diagnosis not present

## 2018-03-04 DIAGNOSIS — Z1211 Encounter for screening for malignant neoplasm of colon: Secondary | ICD-10-CM | POA: Diagnosis not present

## 2018-03-04 DIAGNOSIS — I10 Essential (primary) hypertension: Secondary | ICD-10-CM | POA: Diagnosis not present

## 2018-03-04 DIAGNOSIS — E1165 Type 2 diabetes mellitus with hyperglycemia: Secondary | ICD-10-CM | POA: Diagnosis not present

## 2018-03-04 DIAGNOSIS — R5383 Other fatigue: Secondary | ICD-10-CM | POA: Diagnosis not present

## 2018-03-04 DIAGNOSIS — Z7189 Other specified counseling: Secondary | ICD-10-CM | POA: Diagnosis not present

## 2018-03-04 DIAGNOSIS — E559 Vitamin D deficiency, unspecified: Secondary | ICD-10-CM | POA: Diagnosis not present

## 2018-03-04 DIAGNOSIS — R251 Tremor, unspecified: Secondary | ICD-10-CM | POA: Diagnosis not present

## 2018-03-04 DIAGNOSIS — R350 Frequency of micturition: Secondary | ICD-10-CM | POA: Diagnosis not present

## 2018-03-10 DIAGNOSIS — R413 Other amnesia: Secondary | ICD-10-CM | POA: Diagnosis not present

## 2018-03-11 DIAGNOSIS — H353131 Nonexudative age-related macular degeneration, bilateral, early dry stage: Secondary | ICD-10-CM | POA: Diagnosis not present

## 2018-03-11 DIAGNOSIS — H35373 Puckering of macula, bilateral: Secondary | ICD-10-CM | POA: Diagnosis not present

## 2018-03-11 DIAGNOSIS — H02834 Dermatochalasis of left upper eyelid: Secondary | ICD-10-CM | POA: Diagnosis not present

## 2018-03-11 DIAGNOSIS — H2511 Age-related nuclear cataract, right eye: Secondary | ICD-10-CM | POA: Diagnosis not present

## 2018-03-11 DIAGNOSIS — E119 Type 2 diabetes mellitus without complications: Secondary | ICD-10-CM | POA: Diagnosis not present

## 2018-03-11 DIAGNOSIS — Z961 Presence of intraocular lens: Secondary | ICD-10-CM | POA: Diagnosis not present

## 2018-03-11 DIAGNOSIS — H02831 Dermatochalasis of right upper eyelid: Secondary | ICD-10-CM | POA: Diagnosis not present

## 2018-03-16 DIAGNOSIS — E1140 Type 2 diabetes mellitus with diabetic neuropathy, unspecified: Secondary | ICD-10-CM | POA: Diagnosis not present

## 2018-03-16 DIAGNOSIS — E1159 Type 2 diabetes mellitus with other circulatory complications: Secondary | ICD-10-CM | POA: Diagnosis not present

## 2018-03-16 DIAGNOSIS — H81393 Other peripheral vertigo, bilateral: Secondary | ICD-10-CM | POA: Diagnosis not present

## 2018-03-18 DIAGNOSIS — Z299 Encounter for prophylactic measures, unspecified: Secondary | ICD-10-CM | POA: Diagnosis not present

## 2018-03-18 DIAGNOSIS — I10 Essential (primary) hypertension: Secondary | ICD-10-CM | POA: Diagnosis not present

## 2018-03-18 DIAGNOSIS — E1122 Type 2 diabetes mellitus with diabetic chronic kidney disease: Secondary | ICD-10-CM | POA: Diagnosis not present

## 2018-03-18 DIAGNOSIS — R413 Other amnesia: Secondary | ICD-10-CM | POA: Diagnosis not present

## 2018-03-18 DIAGNOSIS — Z6828 Body mass index (BMI) 28.0-28.9, adult: Secondary | ICD-10-CM | POA: Diagnosis not present

## 2018-03-18 DIAGNOSIS — N183 Chronic kidney disease, stage 3 (moderate): Secondary | ICD-10-CM | POA: Diagnosis not present

## 2018-03-18 DIAGNOSIS — E1165 Type 2 diabetes mellitus with hyperglycemia: Secondary | ICD-10-CM | POA: Diagnosis not present

## 2018-03-19 DIAGNOSIS — I10 Essential (primary) hypertension: Secondary | ICD-10-CM | POA: Diagnosis not present

## 2018-03-19 DIAGNOSIS — E119 Type 2 diabetes mellitus without complications: Secondary | ICD-10-CM | POA: Diagnosis not present

## 2018-04-01 DIAGNOSIS — I10 Essential (primary) hypertension: Secondary | ICD-10-CM | POA: Diagnosis not present

## 2018-04-01 DIAGNOSIS — E1165 Type 2 diabetes mellitus with hyperglycemia: Secondary | ICD-10-CM | POA: Diagnosis not present

## 2018-04-01 DIAGNOSIS — E1122 Type 2 diabetes mellitus with diabetic chronic kidney disease: Secondary | ICD-10-CM | POA: Diagnosis not present

## 2018-04-01 DIAGNOSIS — Z6828 Body mass index (BMI) 28.0-28.9, adult: Secondary | ICD-10-CM | POA: Diagnosis not present

## 2018-04-01 DIAGNOSIS — R251 Tremor, unspecified: Secondary | ICD-10-CM | POA: Diagnosis not present

## 2018-04-01 DIAGNOSIS — R42 Dizziness and giddiness: Secondary | ICD-10-CM | POA: Diagnosis not present

## 2018-04-01 DIAGNOSIS — Z299 Encounter for prophylactic measures, unspecified: Secondary | ICD-10-CM | POA: Diagnosis not present

## 2018-04-14 DIAGNOSIS — I10 Essential (primary) hypertension: Secondary | ICD-10-CM | POA: Diagnosis not present

## 2018-04-14 DIAGNOSIS — E119 Type 2 diabetes mellitus without complications: Secondary | ICD-10-CM | POA: Diagnosis not present

## 2018-04-19 DIAGNOSIS — R69 Illness, unspecified: Secondary | ICD-10-CM | POA: Diagnosis not present

## 2018-05-14 DIAGNOSIS — E119 Type 2 diabetes mellitus without complications: Secondary | ICD-10-CM | POA: Diagnosis not present

## 2018-05-14 DIAGNOSIS — I10 Essential (primary) hypertension: Secondary | ICD-10-CM | POA: Diagnosis not present

## 2018-05-18 DIAGNOSIS — Z299 Encounter for prophylactic measures, unspecified: Secondary | ICD-10-CM | POA: Diagnosis not present

## 2018-05-18 DIAGNOSIS — I10 Essential (primary) hypertension: Secondary | ICD-10-CM | POA: Diagnosis not present

## 2018-05-18 DIAGNOSIS — Z713 Dietary counseling and surveillance: Secondary | ICD-10-CM | POA: Diagnosis not present

## 2018-05-18 DIAGNOSIS — Z6828 Body mass index (BMI) 28.0-28.9, adult: Secondary | ICD-10-CM | POA: Diagnosis not present

## 2018-05-24 ENCOUNTER — Telehealth: Payer: Self-pay | Admitting: Neurology

## 2018-05-24 NOTE — Telephone Encounter (Signed)
Pt called in to cancel her appt I offered her a VV to stated she doesn't want to do one she will rather come in te office for her appt .

## 2018-05-24 NOTE — Telephone Encounter (Signed)
Noted  

## 2018-06-02 ENCOUNTER — Ambulatory Visit: Payer: Medicare HMO | Admitting: Neurology

## 2018-06-09 DIAGNOSIS — I10 Essential (primary) hypertension: Secondary | ICD-10-CM | POA: Diagnosis not present

## 2018-06-09 DIAGNOSIS — E119 Type 2 diabetes mellitus without complications: Secondary | ICD-10-CM | POA: Diagnosis not present

## 2018-07-21 DIAGNOSIS — E119 Type 2 diabetes mellitus without complications: Secondary | ICD-10-CM | POA: Diagnosis not present

## 2018-07-21 DIAGNOSIS — I10 Essential (primary) hypertension: Secondary | ICD-10-CM | POA: Diagnosis not present

## 2018-07-29 ENCOUNTER — Other Ambulatory Visit: Payer: Self-pay

## 2018-07-29 ENCOUNTER — Ambulatory Visit (INDEPENDENT_AMBULATORY_CARE_PROVIDER_SITE_OTHER): Payer: Medicare HMO | Admitting: Neurology

## 2018-07-29 ENCOUNTER — Encounter: Payer: Self-pay | Admitting: Neurology

## 2018-07-29 DIAGNOSIS — G3184 Mild cognitive impairment, so stated: Secondary | ICD-10-CM | POA: Insufficient documentation

## 2018-07-29 DIAGNOSIS — G250 Essential tremor: Secondary | ICD-10-CM | POA: Diagnosis not present

## 2018-07-29 HISTORY — DX: Mild cognitive impairment of uncertain or unknown etiology: G31.84

## 2018-07-29 HISTORY — DX: Essential tremor: G25.0

## 2018-07-29 NOTE — Progress Notes (Signed)
Reason for visit: Tremor  Referring physician: Dr. Gerlene Fee is a 85 y.o. female  History of present illness:  Dana Dixon is an 85 year old right-handed white female with a history of tremor that began about a year ago.  The patient indicates that the tremor affects both hands, she believes that the right is somewhat more prominent than the left.  The patient is right-handed.  She has noted tremors mainly when resting her hands, but she also has tremor with use of her hands, if she tries to take a sip of water or tries to perform handwriting, she will note that the tremor is translated into these activities.  The patient denies any head or neck tremor or any vocal tremor.  She claims that neither 1 of her parents had tremor but her maternal aunt and uncle had tremor and her maternal aunt's daughter, the patient's first cousin, also had tremor.  Her aunt and uncle were diagnosed with Parkinson's disease.  The patient denies any numbness with exception of her right big toe.  She does have some gait instability, she does report some urinary incontinence at times.  She reports a mild memory problem, but this has not interfered with her ability to perform activities of daily living.  She will sometimes have a delay in recalling something, but almost always is able to eventually remember what it is she was trying to think of.  The patient is sent to this office for an evaluation.   Past Medical History:  Diagnosis Date  . Diabetes mellitus without complication ([place])   . High cholesterol   . Hypertension   . Tremor, essential 07/29/2018    Past Surgical History:  Procedure Laterality Date  . ABDOMINAL HYSTERECTOMY    . ABDOMINAL SURGERY    . ANKLE SURGERY  2005   Right   . TONSILLECTOMY      Family History  Problem Relation Age of Onset  . Restless legs syndrome Father   . ALS Father   . Leukemia Maternal Grandfather   . Stroke Paternal Grandmother   . Stroke Maternal  Grandmother     Social history:  reports that she quit smoking about 38 years ago. She has never used smokeless tobacco. She reports current alcohol use of about 7.0 standard drinks of alcohol per week. She reports that she does not use drugs.  Medications:  Prior to Admission medications   Medication Sig Start Date End Date Taking? Authorizing Provider  acetaminophen (TYLENOL) 500 MG tablet Take 500 mg by mouth every 6 (six) hours as needed.   Yes [provider]  amLODipine (NORVASC) 10 MG tablet Take 10 mg by mouth daily. 05/03/18  Yes [provider]  aspirin EC 81 MG tablet Take by mouth.   Yes [provider]  CALCIUM PO Take by mouth.   Yes [provider]  diphenhydrAMINE (BENADRYL) 25 MG tablet Take 25 mg by mouth every 6 (six) hours as needed.   Yes [provider]  metFORMIN (GLUCOPHAGE) 1000 MG tablet Take by mouth. 05/04/15  Yes [provider]  Multiple Vitamin (MULTIVITAMIN) tablet Take 1 tablet by mouth daily.   Yes [provider]  Multiple Vitamins-Minerals (PRESERVISION AREDS PO) Take by mouth.   Yes [provider]  Omega-3 Fatty Acids (FISH OIL PO) Take by mouth.   Yes [provider]  omeprazole (PRILOSEC) 20 MG capsule Take 20 mg by mouth daily. 04/08/18  Yes [provider]  psyllium (METAMUCIL) 58.6 % packet Take 1 packet by mouth daily.   Yes [provider]  simvastatin (ZOCOR) 20 MG tablet Take 20 mg by mouth daily. 05/19/18  Yes [provider]  valsartan (DIOVAN) 160 MG tablet Take 160 mg by mouth daily. 05/30/18  Yes [provider]      Allergies  Allergen Reactions  . Sulfa Antibiotics Nausea And Vomiting    ROS:  Out of a complete 14 system review of symptoms, the patient complains only of the following symptoms, and all other reviewed systems are negative.  Snoring Incontinence of the bladder Memory loss Tremor  Blood pressure 122/60,  pulse (!) 57, temperature 98.2 F (36.8 C), temperature source Temporal, height 5\' 1"  (1.549 m), weight 153 lb (69.4 kg).  Physical Exam  General: The patient is alert and cooperative at the time of the examination.  The patient is moderately obese.  Eyes: Pupils are equal, round, and reactive to light. Discs are flat bilaterally.  Neck: The neck is supple, no carotid bruits are noted.  Respiratory: The respiratory examination is clear.  Cardiovascular: The cardiovascular examination reveals a regular rate and rhythm, no obvious murmurs or rubs are noted.  Skin: Extremities are without significant edema.  Neurologic Exam  Mental status: The patient is alert and oriented x 3 at the time of the examination. The patient has apparent normal recent and remote memory, with an apparently normal attention span and concentration ability.  Cranial nerves: Facial symmetry is present. There is good sensation of the face to pinprick and soft touch bilaterally. The strength of the facial muscles and the muscles to head turning and shoulder shrug are normal bilaterally. Speech is well enunciated, no aphasia or dysarthria is noted. Extraocular movements are full. Visual fields are full. The tongue is midline, and the patient has symmetric elevation of the soft palate. No obvious hearing deficits are noted.  Motor: The motor testing reveals 5 over 5 strength of all 4 extremities. Good symmetric motor tone is noted throughout.  Sensory: Sensory testing is intact to pinprick, soft touch, vibration sensation, and position sense on all 4 extremities. No evidence of extinction is noted.  Coordination: Cerebellar testing reveals good finger-nose-finger and heel-to-shin bilaterally.  The patient does have symmetric resting tremors of the hands, mild tremors noted with arms outstretched.  With drawing a spiral, some tremor is translated into handwriting.  Gait and station: Gait is normal, slight decreased arm  swing is seen bilaterally but this is symmetric. Tandem gait is unsteady. Romberg is negative. No drift is seen.  Reflexes: Deep tendon reflexes are symmetric and normal bilaterally. Toes are downgoing bilaterally.   Assessment/Plan:  1.  Bilateral upper extremity tremor, probable essential tremor  2.  Mild cognitive impairment  The patient appears to have a tremor syndrome most consistent with an essential tremor.  She does have a resting component to the tremor but also has tremor translated into motor activities including handwriting.  The tremor is symmetric from one side to the next.  We will follow the patient conservatively over time, she will be seen in about 6 months.  If the patient develops other symptoms that are suggestive of Parkinson's disease, we will initiate treatment at that time.   Dana Dixon. Keith Helon Wisinski MD 07/29/2018 12:00 PM  [facility] [address] [place] [id number]  Phone [phone] Fax [phone]

## 2018-08-01 DIAGNOSIS — R69 Illness, unspecified: Secondary | ICD-10-CM | POA: Diagnosis not present

## 2018-08-17 DIAGNOSIS — E119 Type 2 diabetes mellitus without complications: Secondary | ICD-10-CM | POA: Diagnosis not present

## 2018-08-17 DIAGNOSIS — I10 Essential (primary) hypertension: Secondary | ICD-10-CM | POA: Diagnosis not present

## 2018-10-18 DIAGNOSIS — I10 Essential (primary) hypertension: Secondary | ICD-10-CM | POA: Diagnosis not present

## 2018-10-18 DIAGNOSIS — E119 Type 2 diabetes mellitus without complications: Secondary | ICD-10-CM | POA: Diagnosis not present

## 2018-10-26 DIAGNOSIS — E1165 Type 2 diabetes mellitus with hyperglycemia: Secondary | ICD-10-CM | POA: Diagnosis not present

## 2018-10-26 DIAGNOSIS — I10 Essential (primary) hypertension: Secondary | ICD-10-CM | POA: Diagnosis not present

## 2018-10-26 DIAGNOSIS — Z299 Encounter for prophylactic measures, unspecified: Secondary | ICD-10-CM | POA: Diagnosis not present

## 2018-10-26 DIAGNOSIS — E1122 Type 2 diabetes mellitus with diabetic chronic kidney disease: Secondary | ICD-10-CM | POA: Diagnosis not present

## 2018-10-26 DIAGNOSIS — Z6828 Body mass index (BMI) 28.0-28.9, adult: Secondary | ICD-10-CM | POA: Diagnosis not present

## 2018-10-26 DIAGNOSIS — N183 Chronic kidney disease, stage 3 (moderate): Secondary | ICD-10-CM | POA: Diagnosis not present

## 2018-10-26 DIAGNOSIS — N390 Urinary tract infection, site not specified: Secondary | ICD-10-CM | POA: Diagnosis not present

## 2018-11-10 DIAGNOSIS — R69 Illness, unspecified: Secondary | ICD-10-CM | POA: Diagnosis not present

## 2018-11-12 DIAGNOSIS — I10 Essential (primary) hypertension: Secondary | ICD-10-CM | POA: Diagnosis not present

## 2018-11-12 DIAGNOSIS — E119 Type 2 diabetes mellitus without complications: Secondary | ICD-10-CM | POA: Diagnosis not present

## 2018-12-02 DIAGNOSIS — R69 Illness, unspecified: Secondary | ICD-10-CM | POA: Diagnosis not present

## 2018-12-07 DIAGNOSIS — I10 Essential (primary) hypertension: Secondary | ICD-10-CM | POA: Diagnosis not present

## 2018-12-07 DIAGNOSIS — E119 Type 2 diabetes mellitus without complications: Secondary | ICD-10-CM | POA: Diagnosis not present

## 2018-12-08 DIAGNOSIS — I129 Hypertensive chronic kidney disease with stage 1 through stage 4 chronic kidney disease, or unspecified chronic kidney disease: Secondary | ICD-10-CM | POA: Diagnosis not present

## 2018-12-08 DIAGNOSIS — K219 Gastro-esophageal reflux disease without esophagitis: Secondary | ICD-10-CM | POA: Diagnosis not present

## 2018-12-08 DIAGNOSIS — N1831 Chronic kidney disease, stage 3a: Secondary | ICD-10-CM | POA: Diagnosis not present

## 2018-12-08 DIAGNOSIS — K5900 Constipation, unspecified: Secondary | ICD-10-CM | POA: Diagnosis not present

## 2018-12-08 DIAGNOSIS — G8929 Other chronic pain: Secondary | ICD-10-CM | POA: Diagnosis not present

## 2018-12-08 DIAGNOSIS — M1990 Unspecified osteoarthritis, unspecified site: Secondary | ICD-10-CM | POA: Diagnosis not present

## 2018-12-08 DIAGNOSIS — R32 Unspecified urinary incontinence: Secondary | ICD-10-CM | POA: Diagnosis not present

## 2018-12-08 DIAGNOSIS — E1122 Type 2 diabetes mellitus with diabetic chronic kidney disease: Secondary | ICD-10-CM | POA: Diagnosis not present

## 2018-12-08 DIAGNOSIS — E785 Hyperlipidemia, unspecified: Secondary | ICD-10-CM | POA: Diagnosis not present

## 2018-12-08 DIAGNOSIS — G3184 Mild cognitive impairment, so stated: Secondary | ICD-10-CM | POA: Diagnosis not present

## 2019-01-06 DIAGNOSIS — E119 Type 2 diabetes mellitus without complications: Secondary | ICD-10-CM | POA: Diagnosis not present

## 2019-01-06 DIAGNOSIS — I10 Essential (primary) hypertension: Secondary | ICD-10-CM | POA: Diagnosis not present

## 2019-02-10 ENCOUNTER — Ambulatory Visit: Payer: Medicare HMO | Admitting: Neurology

## 2019-02-24 DIAGNOSIS — I10 Essential (primary) hypertension: Secondary | ICD-10-CM | POA: Diagnosis not present

## 2019-02-24 DIAGNOSIS — E119 Type 2 diabetes mellitus without complications: Secondary | ICD-10-CM | POA: Diagnosis not present

## 2019-03-08 DIAGNOSIS — Z0000 Encounter for general adult medical examination without abnormal findings: Secondary | ICD-10-CM | POA: Diagnosis not present

## 2019-03-08 DIAGNOSIS — N183 Chronic kidney disease, stage 3 unspecified: Secondary | ICD-10-CM | POA: Diagnosis not present

## 2019-03-08 DIAGNOSIS — Z79899 Other long term (current) drug therapy: Secondary | ICD-10-CM | POA: Diagnosis not present

## 2019-03-08 DIAGNOSIS — R5383 Other fatigue: Secondary | ICD-10-CM | POA: Diagnosis not present

## 2019-03-08 DIAGNOSIS — F419 Anxiety disorder, unspecified: Secondary | ICD-10-CM | POA: Diagnosis not present

## 2019-03-08 DIAGNOSIS — E559 Vitamin D deficiency, unspecified: Secondary | ICD-10-CM | POA: Diagnosis not present

## 2019-03-08 DIAGNOSIS — E1165 Type 2 diabetes mellitus with hyperglycemia: Secondary | ICD-10-CM | POA: Diagnosis not present

## 2019-03-08 DIAGNOSIS — E7800 Pure hypercholesterolemia, unspecified: Secondary | ICD-10-CM | POA: Diagnosis not present

## 2019-03-08 DIAGNOSIS — Z1331 Encounter for screening for depression: Secondary | ICD-10-CM | POA: Diagnosis not present

## 2019-03-08 DIAGNOSIS — Z1211 Encounter for screening for malignant neoplasm of colon: Secondary | ICD-10-CM | POA: Diagnosis not present

## 2019-03-08 DIAGNOSIS — Z1339 Encounter for screening examination for other mental health and behavioral disorders: Secondary | ICD-10-CM | POA: Diagnosis not present

## 2019-03-08 DIAGNOSIS — Z7189 Other specified counseling: Secondary | ICD-10-CM | POA: Diagnosis not present

## 2019-03-24 DIAGNOSIS — I10 Essential (primary) hypertension: Secondary | ICD-10-CM | POA: Diagnosis not present

## 2019-03-24 DIAGNOSIS — E119 Type 2 diabetes mellitus without complications: Secondary | ICD-10-CM | POA: Diagnosis not present

## 2019-03-30 DIAGNOSIS — H02831 Dermatochalasis of right upper eyelid: Secondary | ICD-10-CM | POA: Diagnosis not present

## 2019-03-30 DIAGNOSIS — Z961 Presence of intraocular lens: Secondary | ICD-10-CM | POA: Diagnosis not present

## 2019-03-30 DIAGNOSIS — H25811 Combined forms of age-related cataract, right eye: Secondary | ICD-10-CM | POA: Diagnosis not present

## 2019-03-30 DIAGNOSIS — H353131 Nonexudative age-related macular degeneration, bilateral, early dry stage: Secondary | ICD-10-CM | POA: Diagnosis not present

## 2019-03-30 DIAGNOSIS — H02834 Dermatochalasis of left upper eyelid: Secondary | ICD-10-CM | POA: Diagnosis not present

## 2019-03-30 DIAGNOSIS — H35373 Puckering of macula, bilateral: Secondary | ICD-10-CM | POA: Diagnosis not present

## 2019-03-30 DIAGNOSIS — E119 Type 2 diabetes mellitus without complications: Secondary | ICD-10-CM | POA: Diagnosis not present

## 2019-04-05 DIAGNOSIS — E2839 Other primary ovarian failure: Secondary | ICD-10-CM | POA: Diagnosis not present

## 2019-04-12 DIAGNOSIS — E1122 Type 2 diabetes mellitus with diabetic chronic kidney disease: Secondary | ICD-10-CM | POA: Diagnosis not present

## 2019-04-12 DIAGNOSIS — M5430 Sciatica, unspecified side: Secondary | ICD-10-CM | POA: Diagnosis not present

## 2019-04-12 DIAGNOSIS — Z299 Encounter for prophylactic measures, unspecified: Secondary | ICD-10-CM | POA: Diagnosis not present

## 2019-04-12 DIAGNOSIS — M79605 Pain in left leg: Secondary | ICD-10-CM | POA: Diagnosis not present

## 2019-04-12 DIAGNOSIS — N183 Chronic kidney disease, stage 3 unspecified: Secondary | ICD-10-CM | POA: Diagnosis not present

## 2019-04-12 DIAGNOSIS — E1165 Type 2 diabetes mellitus with hyperglycemia: Secondary | ICD-10-CM | POA: Diagnosis not present

## 2019-04-12 DIAGNOSIS — I10 Essential (primary) hypertension: Secondary | ICD-10-CM | POA: Diagnosis not present

## 2019-04-29 DIAGNOSIS — H25811 Combined forms of age-related cataract, right eye: Secondary | ICD-10-CM | POA: Diagnosis not present

## 2019-05-10 DIAGNOSIS — E119 Type 2 diabetes mellitus without complications: Secondary | ICD-10-CM | POA: Diagnosis not present

## 2019-05-10 DIAGNOSIS — I10 Essential (primary) hypertension: Secondary | ICD-10-CM | POA: Diagnosis not present

## 2019-06-16 DIAGNOSIS — E1165 Type 2 diabetes mellitus with hyperglycemia: Secondary | ICD-10-CM | POA: Diagnosis not present

## 2019-06-16 DIAGNOSIS — I10 Essential (primary) hypertension: Secondary | ICD-10-CM | POA: Diagnosis not present

## 2019-06-16 DIAGNOSIS — Z87891 Personal history of nicotine dependence: Secondary | ICD-10-CM | POA: Diagnosis not present

## 2019-06-16 DIAGNOSIS — N183 Chronic kidney disease, stage 3 unspecified: Secondary | ICD-10-CM | POA: Diagnosis not present

## 2019-06-16 DIAGNOSIS — E1122 Type 2 diabetes mellitus with diabetic chronic kidney disease: Secondary | ICD-10-CM | POA: Diagnosis not present

## 2019-06-16 DIAGNOSIS — Z299 Encounter for prophylactic measures, unspecified: Secondary | ICD-10-CM | POA: Diagnosis not present

## 2019-07-03 DIAGNOSIS — E119 Type 2 diabetes mellitus without complications: Secondary | ICD-10-CM | POA: Diagnosis not present

## 2019-07-03 DIAGNOSIS — I10 Essential (primary) hypertension: Secondary | ICD-10-CM | POA: Diagnosis not present

## 2019-08-03 DIAGNOSIS — E119 Type 2 diabetes mellitus without complications: Secondary | ICD-10-CM | POA: Diagnosis not present

## 2019-08-03 DIAGNOSIS — I10 Essential (primary) hypertension: Secondary | ICD-10-CM | POA: Diagnosis not present

## 2019-09-02 DIAGNOSIS — E119 Type 2 diabetes mellitus without complications: Secondary | ICD-10-CM | POA: Diagnosis not present

## 2019-09-02 DIAGNOSIS — I10 Essential (primary) hypertension: Secondary | ICD-10-CM | POA: Diagnosis not present

## 2019-09-27 DIAGNOSIS — E119 Type 2 diabetes mellitus without complications: Secondary | ICD-10-CM | POA: Diagnosis not present

## 2019-09-27 DIAGNOSIS — I10 Essential (primary) hypertension: Secondary | ICD-10-CM | POA: Diagnosis not present

## 2019-10-05 DIAGNOSIS — E1122 Type 2 diabetes mellitus with diabetic chronic kidney disease: Secondary | ICD-10-CM | POA: Diagnosis not present

## 2019-10-05 DIAGNOSIS — E1165 Type 2 diabetes mellitus with hyperglycemia: Secondary | ICD-10-CM | POA: Diagnosis not present

## 2019-10-05 DIAGNOSIS — I10 Essential (primary) hypertension: Secondary | ICD-10-CM | POA: Diagnosis not present

## 2019-10-05 DIAGNOSIS — Z299 Encounter for prophylactic measures, unspecified: Secondary | ICD-10-CM | POA: Diagnosis not present

## 2019-10-05 DIAGNOSIS — N183 Chronic kidney disease, stage 3 unspecified: Secondary | ICD-10-CM | POA: Diagnosis not present

## 2019-12-02 DIAGNOSIS — E119 Type 2 diabetes mellitus without complications: Secondary | ICD-10-CM | POA: Diagnosis not present

## 2019-12-02 DIAGNOSIS — I10 Essential (primary) hypertension: Secondary | ICD-10-CM | POA: Diagnosis not present

## 2019-12-06 DIAGNOSIS — H02831 Dermatochalasis of right upper eyelid: Secondary | ICD-10-CM | POA: Diagnosis not present

## 2019-12-06 DIAGNOSIS — E119 Type 2 diabetes mellitus without complications: Secondary | ICD-10-CM | POA: Diagnosis not present

## 2019-12-06 DIAGNOSIS — H35373 Puckering of macula, bilateral: Secondary | ICD-10-CM | POA: Diagnosis not present

## 2019-12-06 DIAGNOSIS — H353131 Nonexudative age-related macular degeneration, bilateral, early dry stage: Secondary | ICD-10-CM | POA: Diagnosis not present

## 2019-12-06 DIAGNOSIS — Z961 Presence of intraocular lens: Secondary | ICD-10-CM | POA: Diagnosis not present

## 2019-12-06 DIAGNOSIS — H02834 Dermatochalasis of left upper eyelid: Secondary | ICD-10-CM | POA: Diagnosis not present

## 2019-12-06 DIAGNOSIS — H43811 Vitreous degeneration, right eye: Secondary | ICD-10-CM | POA: Diagnosis not present

## 2020-01-03 DIAGNOSIS — I10 Essential (primary) hypertension: Secondary | ICD-10-CM | POA: Diagnosis not present

## 2020-01-03 DIAGNOSIS — E119 Type 2 diabetes mellitus without complications: Secondary | ICD-10-CM | POA: Diagnosis not present

## 2020-02-02 DIAGNOSIS — E119 Type 2 diabetes mellitus without complications: Secondary | ICD-10-CM | POA: Diagnosis not present

## 2020-02-02 DIAGNOSIS — I10 Essential (primary) hypertension: Secondary | ICD-10-CM | POA: Diagnosis not present

## 2020-03-09 DIAGNOSIS — E7800 Pure hypercholesterolemia, unspecified: Secondary | ICD-10-CM | POA: Diagnosis not present

## 2020-03-09 DIAGNOSIS — Z0000 Encounter for general adult medical examination without abnormal findings: Secondary | ICD-10-CM | POA: Diagnosis not present

## 2020-03-09 DIAGNOSIS — Z79899 Other long term (current) drug therapy: Secondary | ICD-10-CM | POA: Diagnosis not present

## 2020-03-09 DIAGNOSIS — R69 Illness, unspecified: Secondary | ICD-10-CM | POA: Diagnosis not present

## 2020-03-09 DIAGNOSIS — Z7189 Other specified counseling: Secondary | ICD-10-CM | POA: Diagnosis not present

## 2020-03-09 DIAGNOSIS — E1165 Type 2 diabetes mellitus with hyperglycemia: Secondary | ICD-10-CM | POA: Diagnosis not present

## 2020-03-09 DIAGNOSIS — T300 Burn of unspecified body region, unspecified degree: Secondary | ICD-10-CM | POA: Diagnosis not present

## 2020-03-09 DIAGNOSIS — Z1331 Encounter for screening for depression: Secondary | ICD-10-CM | POA: Diagnosis not present

## 2020-03-09 DIAGNOSIS — Z1339 Encounter for screening examination for other mental health and behavioral disorders: Secondary | ICD-10-CM | POA: Diagnosis not present

## 2020-03-09 DIAGNOSIS — Z299 Encounter for prophylactic measures, unspecified: Secondary | ICD-10-CM | POA: Diagnosis not present

## 2020-03-09 DIAGNOSIS — Z6826 Body mass index (BMI) 26.0-26.9, adult: Secondary | ICD-10-CM | POA: Diagnosis not present

## 2020-03-09 DIAGNOSIS — E559 Vitamin D deficiency, unspecified: Secondary | ICD-10-CM | POA: Diagnosis not present

## 2020-03-09 DIAGNOSIS — R54 Age-related physical debility: Secondary | ICD-10-CM | POA: Diagnosis not present

## 2020-04-17 DIAGNOSIS — E785 Hyperlipidemia, unspecified: Secondary | ICD-10-CM | POA: Diagnosis not present

## 2020-04-17 DIAGNOSIS — E1122 Type 2 diabetes mellitus with diabetic chronic kidney disease: Secondary | ICD-10-CM | POA: Diagnosis not present

## 2020-04-17 DIAGNOSIS — E663 Overweight: Secondary | ICD-10-CM | POA: Diagnosis not present

## 2020-04-17 DIAGNOSIS — G8929 Other chronic pain: Secondary | ICD-10-CM | POA: Diagnosis not present

## 2020-04-17 DIAGNOSIS — R69 Illness, unspecified: Secondary | ICD-10-CM | POA: Diagnosis not present

## 2020-04-17 DIAGNOSIS — Z008 Encounter for other general examination: Secondary | ICD-10-CM | POA: Diagnosis not present

## 2020-04-17 DIAGNOSIS — E1151 Type 2 diabetes mellitus with diabetic peripheral angiopathy without gangrene: Secondary | ICD-10-CM | POA: Diagnosis not present

## 2020-04-17 DIAGNOSIS — G4700 Insomnia, unspecified: Secondary | ICD-10-CM | POA: Diagnosis not present

## 2020-04-17 DIAGNOSIS — H3530 Unspecified macular degeneration: Secondary | ICD-10-CM | POA: Diagnosis not present

## 2020-04-17 DIAGNOSIS — F419 Anxiety disorder, unspecified: Secondary | ICD-10-CM | POA: Diagnosis not present

## 2020-06-12 DIAGNOSIS — Z87891 Personal history of nicotine dependence: Secondary | ICD-10-CM | POA: Diagnosis not present

## 2020-06-12 DIAGNOSIS — Z6826 Body mass index (BMI) 26.0-26.9, adult: Secondary | ICD-10-CM | POA: Diagnosis not present

## 2020-06-12 DIAGNOSIS — R1310 Dysphagia, unspecified: Secondary | ICD-10-CM | POA: Diagnosis not present

## 2020-06-12 DIAGNOSIS — E1165 Type 2 diabetes mellitus with hyperglycemia: Secondary | ICD-10-CM | POA: Diagnosis not present

## 2020-06-12 DIAGNOSIS — Z299 Encounter for prophylactic measures, unspecified: Secondary | ICD-10-CM | POA: Diagnosis not present

## 2020-06-12 DIAGNOSIS — I10 Essential (primary) hypertension: Secondary | ICD-10-CM | POA: Diagnosis not present

## 2020-06-12 DIAGNOSIS — E1122 Type 2 diabetes mellitus with diabetic chronic kidney disease: Secondary | ICD-10-CM | POA: Diagnosis not present

## 2020-09-18 DIAGNOSIS — Z299 Encounter for prophylactic measures, unspecified: Secondary | ICD-10-CM | POA: Diagnosis not present

## 2020-09-18 DIAGNOSIS — N183 Chronic kidney disease, stage 3 unspecified: Secondary | ICD-10-CM | POA: Diagnosis not present

## 2020-09-18 DIAGNOSIS — E1122 Type 2 diabetes mellitus with diabetic chronic kidney disease: Secondary | ICD-10-CM | POA: Diagnosis not present

## 2020-09-18 DIAGNOSIS — I10 Essential (primary) hypertension: Secondary | ICD-10-CM | POA: Diagnosis not present

## 2020-09-18 DIAGNOSIS — E1165 Type 2 diabetes mellitus with hyperglycemia: Secondary | ICD-10-CM | POA: Diagnosis not present

## 2020-10-03 DIAGNOSIS — N183 Chronic kidney disease, stage 3 unspecified: Secondary | ICD-10-CM | POA: Diagnosis not present

## 2020-10-03 DIAGNOSIS — I10 Essential (primary) hypertension: Secondary | ICD-10-CM | POA: Diagnosis not present

## 2020-11-02 DIAGNOSIS — F329 Major depressive disorder, single episode, unspecified: Secondary | ICD-10-CM | POA: Diagnosis not present

## 2020-11-02 DIAGNOSIS — R69 Illness, unspecified: Secondary | ICD-10-CM | POA: Diagnosis not present

## 2020-11-12 DIAGNOSIS — Z23 Encounter for immunization: Secondary | ICD-10-CM | POA: Diagnosis not present

## 2020-11-12 DIAGNOSIS — L309 Dermatitis, unspecified: Secondary | ICD-10-CM | POA: Diagnosis not present

## 2020-11-12 DIAGNOSIS — Z299 Encounter for prophylactic measures, unspecified: Secondary | ICD-10-CM | POA: Diagnosis not present

## 2020-11-12 DIAGNOSIS — R42 Dizziness and giddiness: Secondary | ICD-10-CM | POA: Diagnosis not present

## 2020-11-12 DIAGNOSIS — K219 Gastro-esophageal reflux disease without esophagitis: Secondary | ICD-10-CM | POA: Diagnosis not present

## 2020-11-12 DIAGNOSIS — L0390 Cellulitis, unspecified: Secondary | ICD-10-CM | POA: Diagnosis not present

## 2020-12-03 DIAGNOSIS — N183 Chronic kidney disease, stage 3 unspecified: Secondary | ICD-10-CM | POA: Diagnosis not present

## 2020-12-03 DIAGNOSIS — I10 Essential (primary) hypertension: Secondary | ICD-10-CM | POA: Diagnosis not present

## 2020-12-06 DIAGNOSIS — H02834 Dermatochalasis of left upper eyelid: Secondary | ICD-10-CM | POA: Diagnosis not present

## 2020-12-06 DIAGNOSIS — Z961 Presence of intraocular lens: Secondary | ICD-10-CM | POA: Diagnosis not present

## 2020-12-06 DIAGNOSIS — E119 Type 2 diabetes mellitus without complications: Secondary | ICD-10-CM | POA: Diagnosis not present

## 2020-12-06 DIAGNOSIS — H353131 Nonexudative age-related macular degeneration, bilateral, early dry stage: Secondary | ICD-10-CM | POA: Diagnosis not present

## 2020-12-06 DIAGNOSIS — H35373 Puckering of macula, bilateral: Secondary | ICD-10-CM | POA: Diagnosis not present

## 2020-12-06 DIAGNOSIS — H02831 Dermatochalasis of right upper eyelid: Secondary | ICD-10-CM | POA: Diagnosis not present

## 2020-12-06 DIAGNOSIS — H43811 Vitreous degeneration, right eye: Secondary | ICD-10-CM | POA: Diagnosis not present

## 2020-12-20 DIAGNOSIS — E1122 Type 2 diabetes mellitus with diabetic chronic kidney disease: Secondary | ICD-10-CM | POA: Diagnosis not present

## 2020-12-20 DIAGNOSIS — Z299 Encounter for prophylactic measures, unspecified: Secondary | ICD-10-CM | POA: Diagnosis not present

## 2020-12-20 DIAGNOSIS — R69 Illness, unspecified: Secondary | ICD-10-CM | POA: Diagnosis not present

## 2020-12-20 DIAGNOSIS — E1165 Type 2 diabetes mellitus with hyperglycemia: Secondary | ICD-10-CM | POA: Diagnosis not present

## 2020-12-20 DIAGNOSIS — I10 Essential (primary) hypertension: Secondary | ICD-10-CM | POA: Diagnosis not present

## 2021-01-02 DIAGNOSIS — F329 Major depressive disorder, single episode, unspecified: Secondary | ICD-10-CM | POA: Diagnosis not present

## 2021-01-02 DIAGNOSIS — R69 Illness, unspecified: Secondary | ICD-10-CM | POA: Diagnosis not present

## 2021-03-12 DIAGNOSIS — Z1339 Encounter for screening examination for other mental health and behavioral disorders: Secondary | ICD-10-CM | POA: Diagnosis not present

## 2021-03-12 DIAGNOSIS — R54 Age-related physical debility: Secondary | ICD-10-CM | POA: Diagnosis not present

## 2021-03-12 DIAGNOSIS — Z1331 Encounter for screening for depression: Secondary | ICD-10-CM | POA: Diagnosis not present

## 2021-03-12 DIAGNOSIS — Z7189 Other specified counseling: Secondary | ICD-10-CM | POA: Diagnosis not present

## 2021-03-12 DIAGNOSIS — R69 Illness, unspecified: Secondary | ICD-10-CM | POA: Diagnosis not present

## 2021-03-12 DIAGNOSIS — F419 Anxiety disorder, unspecified: Secondary | ICD-10-CM | POA: Diagnosis not present

## 2021-03-12 DIAGNOSIS — Z79899 Other long term (current) drug therapy: Secondary | ICD-10-CM | POA: Diagnosis not present

## 2021-03-12 DIAGNOSIS — Z299 Encounter for prophylactic measures, unspecified: Secondary | ICD-10-CM | POA: Diagnosis not present

## 2021-03-12 DIAGNOSIS — E7800 Pure hypercholesterolemia, unspecified: Secondary | ICD-10-CM | POA: Diagnosis not present

## 2021-03-12 DIAGNOSIS — Z6825 Body mass index (BMI) 25.0-25.9, adult: Secondary | ICD-10-CM | POA: Diagnosis not present

## 2021-03-12 DIAGNOSIS — Z87891 Personal history of nicotine dependence: Secondary | ICD-10-CM | POA: Diagnosis not present

## 2021-03-12 DIAGNOSIS — R32 Unspecified urinary incontinence: Secondary | ICD-10-CM | POA: Diagnosis not present

## 2021-03-12 DIAGNOSIS — E559 Vitamin D deficiency, unspecified: Secondary | ICD-10-CM | POA: Diagnosis not present

## 2021-03-12 DIAGNOSIS — Z0000 Encounter for general adult medical examination without abnormal findings: Secondary | ICD-10-CM | POA: Diagnosis not present

## 2021-03-25 DIAGNOSIS — R011 Cardiac murmur, unspecified: Secondary | ICD-10-CM | POA: Diagnosis not present

## 2021-04-09 DIAGNOSIS — E2839 Other primary ovarian failure: Secondary | ICD-10-CM | POA: Diagnosis not present

## 2021-04-16 DIAGNOSIS — M8580 Other specified disorders of bone density and structure, unspecified site: Secondary | ICD-10-CM | POA: Diagnosis not present

## 2021-04-16 DIAGNOSIS — Z87891 Personal history of nicotine dependence: Secondary | ICD-10-CM | POA: Diagnosis not present

## 2021-04-16 DIAGNOSIS — Z299 Encounter for prophylactic measures, unspecified: Secondary | ICD-10-CM | POA: Diagnosis not present

## 2021-04-16 DIAGNOSIS — I340 Nonrheumatic mitral (valve) insufficiency: Secondary | ICD-10-CM | POA: Diagnosis not present

## 2021-04-16 DIAGNOSIS — E1165 Type 2 diabetes mellitus with hyperglycemia: Secondary | ICD-10-CM | POA: Diagnosis not present

## 2021-04-16 DIAGNOSIS — E875 Hyperkalemia: Secondary | ICD-10-CM | POA: Diagnosis not present

## 2021-04-16 DIAGNOSIS — E1122 Type 2 diabetes mellitus with diabetic chronic kidney disease: Secondary | ICD-10-CM | POA: Diagnosis not present

## 2021-04-16 DIAGNOSIS — Z6825 Body mass index (BMI) 25.0-25.9, adult: Secondary | ICD-10-CM | POA: Diagnosis not present

## 2021-04-16 DIAGNOSIS — I10 Essential (primary) hypertension: Secondary | ICD-10-CM | POA: Diagnosis not present

## 2021-04-17 DIAGNOSIS — R5383 Other fatigue: Secondary | ICD-10-CM | POA: Diagnosis not present

## 2021-05-22 DIAGNOSIS — N390 Urinary tract infection, site not specified: Secondary | ICD-10-CM | POA: Diagnosis not present

## 2021-05-22 DIAGNOSIS — Z299 Encounter for prophylactic measures, unspecified: Secondary | ICD-10-CM | POA: Diagnosis not present

## 2021-05-22 DIAGNOSIS — I10 Essential (primary) hypertension: Secondary | ICD-10-CM | POA: Diagnosis not present

## 2021-05-22 DIAGNOSIS — Z6825 Body mass index (BMI) 25.0-25.9, adult: Secondary | ICD-10-CM | POA: Diagnosis not present

## 2021-05-22 DIAGNOSIS — R040 Epistaxis: Secondary | ICD-10-CM | POA: Diagnosis not present

## 2021-05-22 DIAGNOSIS — R350 Frequency of micturition: Secondary | ICD-10-CM | POA: Diagnosis not present

## 2021-07-09 DIAGNOSIS — E785 Hyperlipidemia, unspecified: Secondary | ICD-10-CM | POA: Diagnosis not present

## 2021-07-09 DIAGNOSIS — K2100 Gastro-esophageal reflux disease with esophagitis, without bleeding: Secondary | ICD-10-CM | POA: Diagnosis not present

## 2021-07-09 DIAGNOSIS — Z7982 Long term (current) use of aspirin: Secondary | ICD-10-CM | POA: Diagnosis not present

## 2021-07-09 DIAGNOSIS — E119 Type 2 diabetes mellitus without complications: Secondary | ICD-10-CM | POA: Diagnosis not present

## 2021-07-09 DIAGNOSIS — F419 Anxiety disorder, unspecified: Secondary | ICD-10-CM | POA: Diagnosis not present

## 2021-07-09 DIAGNOSIS — R69 Illness, unspecified: Secondary | ICD-10-CM | POA: Diagnosis not present

## 2021-07-09 DIAGNOSIS — Z7984 Long term (current) use of oral hypoglycemic drugs: Secondary | ICD-10-CM | POA: Diagnosis not present

## 2021-07-09 DIAGNOSIS — R251 Tremor, unspecified: Secondary | ICD-10-CM | POA: Diagnosis not present

## 2021-07-09 DIAGNOSIS — R32 Unspecified urinary incontinence: Secondary | ICD-10-CM | POA: Diagnosis not present

## 2021-07-09 DIAGNOSIS — Z8249 Family history of ischemic heart disease and other diseases of the circulatory system: Secondary | ICD-10-CM | POA: Diagnosis not present

## 2021-07-09 DIAGNOSIS — I10 Essential (primary) hypertension: Secondary | ICD-10-CM | POA: Diagnosis not present

## 2021-07-23 DIAGNOSIS — Z0000 Encounter for general adult medical examination without abnormal findings: Secondary | ICD-10-CM | POA: Diagnosis not present

## 2021-07-23 DIAGNOSIS — E1165 Type 2 diabetes mellitus with hyperglycemia: Secondary | ICD-10-CM | POA: Diagnosis not present

## 2021-07-23 DIAGNOSIS — I10 Essential (primary) hypertension: Secondary | ICD-10-CM | POA: Diagnosis not present

## 2021-07-23 DIAGNOSIS — Z299 Encounter for prophylactic measures, unspecified: Secondary | ICD-10-CM | POA: Diagnosis not present

## 2021-07-23 DIAGNOSIS — Z6825 Body mass index (BMI) 25.0-25.9, adult: Secondary | ICD-10-CM | POA: Diagnosis not present

## 2021-07-23 DIAGNOSIS — Z713 Dietary counseling and surveillance: Secondary | ICD-10-CM | POA: Diagnosis not present

## 2021-09-13 DIAGNOSIS — Z23 Encounter for immunization: Secondary | ICD-10-CM | POA: Diagnosis not present

## 2021-11-06 DIAGNOSIS — I10 Essential (primary) hypertension: Secondary | ICD-10-CM | POA: Diagnosis not present

## 2021-11-06 DIAGNOSIS — N183 Chronic kidney disease, stage 3 unspecified: Secondary | ICD-10-CM | POA: Diagnosis not present

## 2021-11-06 DIAGNOSIS — E1165 Type 2 diabetes mellitus with hyperglycemia: Secondary | ICD-10-CM | POA: Diagnosis not present

## 2021-11-06 DIAGNOSIS — Z299 Encounter for prophylactic measures, unspecified: Secondary | ICD-10-CM | POA: Diagnosis not present

## 2021-11-06 DIAGNOSIS — R69 Illness, unspecified: Secondary | ICD-10-CM | POA: Diagnosis not present

## 2022-03-18 DIAGNOSIS — Z0000 Encounter for general adult medical examination without abnormal findings: Secondary | ICD-10-CM | POA: Diagnosis not present

## 2022-03-18 DIAGNOSIS — E559 Vitamin D deficiency, unspecified: Secondary | ICD-10-CM | POA: Diagnosis not present

## 2022-03-18 DIAGNOSIS — R69 Illness, unspecified: Secondary | ICD-10-CM | POA: Diagnosis not present

## 2022-03-18 DIAGNOSIS — Z7189 Other specified counseling: Secondary | ICD-10-CM | POA: Diagnosis not present

## 2022-03-18 DIAGNOSIS — Z299 Encounter for prophylactic measures, unspecified: Secondary | ICD-10-CM | POA: Diagnosis not present

## 2022-03-18 DIAGNOSIS — Z6825 Body mass index (BMI) 25.0-25.9, adult: Secondary | ICD-10-CM | POA: Diagnosis not present

## 2022-03-18 DIAGNOSIS — Z79899 Other long term (current) drug therapy: Secondary | ICD-10-CM | POA: Diagnosis not present

## 2022-03-18 DIAGNOSIS — Z1339 Encounter for screening examination for other mental health and behavioral disorders: Secondary | ICD-10-CM | POA: Diagnosis not present

## 2022-03-18 DIAGNOSIS — F419 Anxiety disorder, unspecified: Secondary | ICD-10-CM | POA: Diagnosis not present

## 2022-03-18 DIAGNOSIS — E7800 Pure hypercholesterolemia, unspecified: Secondary | ICD-10-CM | POA: Diagnosis not present

## 2022-03-18 DIAGNOSIS — Z1331 Encounter for screening for depression: Secondary | ICD-10-CM | POA: Diagnosis not present

## 2022-03-18 DIAGNOSIS — I10 Essential (primary) hypertension: Secondary | ICD-10-CM | POA: Diagnosis not present

## 2022-03-19 DIAGNOSIS — R5383 Other fatigue: Secondary | ICD-10-CM | POA: Diagnosis not present

## 2022-04-17 DIAGNOSIS — N183 Chronic kidney disease, stage 3 unspecified: Secondary | ICD-10-CM | POA: Diagnosis not present

## 2022-04-17 DIAGNOSIS — E1165 Type 2 diabetes mellitus with hyperglycemia: Secondary | ICD-10-CM | POA: Diagnosis not present

## 2022-04-17 DIAGNOSIS — Z299 Encounter for prophylactic measures, unspecified: Secondary | ICD-10-CM | POA: Diagnosis not present

## 2022-04-17 DIAGNOSIS — R5383 Other fatigue: Secondary | ICD-10-CM | POA: Diagnosis not present

## 2022-04-17 DIAGNOSIS — E875 Hyperkalemia: Secondary | ICD-10-CM | POA: Diagnosis not present

## 2022-04-17 DIAGNOSIS — E1122 Type 2 diabetes mellitus with diabetic chronic kidney disease: Secondary | ICD-10-CM | POA: Diagnosis not present

## 2022-04-17 DIAGNOSIS — I10 Essential (primary) hypertension: Secondary | ICD-10-CM | POA: Diagnosis not present

## 2022-07-31 DIAGNOSIS — Z299 Encounter for prophylactic measures, unspecified: Secondary | ICD-10-CM | POA: Diagnosis not present

## 2022-07-31 DIAGNOSIS — I10 Essential (primary) hypertension: Secondary | ICD-10-CM | POA: Diagnosis not present

## 2022-07-31 DIAGNOSIS — F332 Major depressive disorder, recurrent severe without psychotic features: Secondary | ICD-10-CM | POA: Diagnosis not present

## 2022-07-31 DIAGNOSIS — Z0000 Encounter for general adult medical examination without abnormal findings: Secondary | ICD-10-CM | POA: Diagnosis not present

## 2022-07-31 DIAGNOSIS — E1165 Type 2 diabetes mellitus with hyperglycemia: Secondary | ICD-10-CM | POA: Diagnosis not present

## 2022-08-22 DIAGNOSIS — Z008 Encounter for other general examination: Secondary | ICD-10-CM | POA: Diagnosis not present

## 2022-08-22 DIAGNOSIS — E785 Hyperlipidemia, unspecified: Secondary | ICD-10-CM | POA: Diagnosis not present

## 2022-08-22 DIAGNOSIS — M1990 Unspecified osteoarthritis, unspecified site: Secondary | ICD-10-CM | POA: Diagnosis not present

## 2022-08-22 DIAGNOSIS — R32 Unspecified urinary incontinence: Secondary | ICD-10-CM | POA: Diagnosis not present

## 2022-08-22 DIAGNOSIS — I129 Hypertensive chronic kidney disease with stage 1 through stage 4 chronic kidney disease, or unspecified chronic kidney disease: Secondary | ICD-10-CM | POA: Diagnosis not present

## 2022-08-22 DIAGNOSIS — K219 Gastro-esophageal reflux disease without esophagitis: Secondary | ICD-10-CM | POA: Diagnosis not present

## 2022-08-22 DIAGNOSIS — E1122 Type 2 diabetes mellitus with diabetic chronic kidney disease: Secondary | ICD-10-CM | POA: Diagnosis not present

## 2022-08-22 DIAGNOSIS — R251 Tremor, unspecified: Secondary | ICD-10-CM | POA: Diagnosis not present

## 2022-08-22 DIAGNOSIS — R011 Cardiac murmur, unspecified: Secondary | ICD-10-CM | POA: Diagnosis not present

## 2022-08-22 DIAGNOSIS — N1831 Chronic kidney disease, stage 3a: Secondary | ICD-10-CM | POA: Diagnosis not present

## 2022-08-22 DIAGNOSIS — F325 Major depressive disorder, single episode, in full remission: Secondary | ICD-10-CM | POA: Diagnosis not present

## 2022-08-22 DIAGNOSIS — F419 Anxiety disorder, unspecified: Secondary | ICD-10-CM | POA: Diagnosis not present

## 2022-08-22 DIAGNOSIS — E1151 Type 2 diabetes mellitus with diabetic peripheral angiopathy without gangrene: Secondary | ICD-10-CM | POA: Diagnosis not present

## 2022-10-15 DIAGNOSIS — I10 Essential (primary) hypertension: Secondary | ICD-10-CM | POA: Diagnosis not present

## 2022-10-15 DIAGNOSIS — R251 Tremor, unspecified: Secondary | ICD-10-CM | POA: Diagnosis not present

## 2022-10-15 DIAGNOSIS — R197 Diarrhea, unspecified: Secondary | ICD-10-CM | POA: Diagnosis not present

## 2022-10-15 DIAGNOSIS — E1169 Type 2 diabetes mellitus with other specified complication: Secondary | ICD-10-CM | POA: Diagnosis not present

## 2022-10-15 DIAGNOSIS — Z299 Encounter for prophylactic measures, unspecified: Secondary | ICD-10-CM | POA: Diagnosis not present

## 2022-12-17 DIAGNOSIS — H02834 Dermatochalasis of left upper eyelid: Secondary | ICD-10-CM | POA: Diagnosis not present

## 2022-12-17 DIAGNOSIS — H43811 Vitreous degeneration, right eye: Secondary | ICD-10-CM | POA: Diagnosis not present

## 2022-12-17 DIAGNOSIS — H35373 Puckering of macula, bilateral: Secondary | ICD-10-CM | POA: Diagnosis not present

## 2022-12-17 DIAGNOSIS — E119 Type 2 diabetes mellitus without complications: Secondary | ICD-10-CM | POA: Diagnosis not present

## 2022-12-17 DIAGNOSIS — H353131 Nonexudative age-related macular degeneration, bilateral, early dry stage: Secondary | ICD-10-CM | POA: Diagnosis not present

## 2022-12-17 DIAGNOSIS — H02831 Dermatochalasis of right upper eyelid: Secondary | ICD-10-CM | POA: Diagnosis not present

## 2022-12-17 DIAGNOSIS — Z961 Presence of intraocular lens: Secondary | ICD-10-CM | POA: Diagnosis not present

## 2023-03-23 DIAGNOSIS — E7800 Pure hypercholesterolemia, unspecified: Secondary | ICD-10-CM | POA: Diagnosis not present

## 2023-03-23 DIAGNOSIS — Z79899 Other long term (current) drug therapy: Secondary | ICD-10-CM | POA: Diagnosis not present

## 2023-03-23 DIAGNOSIS — F419 Anxiety disorder, unspecified: Secondary | ICD-10-CM | POA: Diagnosis not present

## 2023-03-23 DIAGNOSIS — Z0000 Encounter for general adult medical examination without abnormal findings: Secondary | ICD-10-CM | POA: Diagnosis not present

## 2023-10-27 DIAGNOSIS — Z008 Encounter for other general examination: Secondary | ICD-10-CM | POA: Diagnosis not present

## 2024-01-04 DIAGNOSIS — Z961 Presence of intraocular lens: Secondary | ICD-10-CM | POA: Diagnosis not present

## 2024-01-04 DIAGNOSIS — H35373 Puckering of macula, bilateral: Secondary | ICD-10-CM | POA: Diagnosis not present

## 2024-01-04 DIAGNOSIS — H02834 Dermatochalasis of left upper eyelid: Secondary | ICD-10-CM | POA: Diagnosis not present

## 2024-01-04 DIAGNOSIS — H353131 Nonexudative age-related macular degeneration, bilateral, early dry stage: Secondary | ICD-10-CM | POA: Diagnosis not present

## 2024-01-04 DIAGNOSIS — H43811 Vitreous degeneration, right eye: Secondary | ICD-10-CM | POA: Diagnosis not present

## 2024-01-04 DIAGNOSIS — H02831 Dermatochalasis of right upper eyelid: Secondary | ICD-10-CM | POA: Diagnosis not present

## 2024-01-04 DIAGNOSIS — E119 Type 2 diabetes mellitus without complications: Secondary | ICD-10-CM | POA: Diagnosis not present
